# Patient Record
Sex: Male | Born: 1985
Health system: Southern US, Community
[De-identification: ages and names within clinical notes are randomized; demographics above are authoritative.]

## PROBLEM LIST (undated history)

## (undated) DIAGNOSIS — I8390 Asymptomatic varicose veins of unspecified lower extremity: Secondary | ICD-10-CM

## (undated) DIAGNOSIS — M4310 Spondylolisthesis, site unspecified: Secondary | ICD-10-CM

## (undated) DIAGNOSIS — Z9889 Other specified postprocedural states: Secondary | ICD-10-CM

## (undated) DIAGNOSIS — Q909 Down syndrome, unspecified: Secondary | ICD-10-CM

## (undated) DIAGNOSIS — E039 Hypothyroidism, unspecified: Secondary | ICD-10-CM

## (undated) HISTORY — PX: ABLATION SAPHENOUS VEIN W/ RFA: SUR11

## (undated) HISTORY — DX: Hypothyroidism, unspecified: E03.9

## (undated) HISTORY — DX: Down syndrome, unspecified: Q90.9

## (undated) HISTORY — PX: WISDOM TOOTH EXTRACTION: SHX21

## (undated) HISTORY — PX: SPINE SURGERY: SHX786

## (undated) HISTORY — DX: Asymptomatic varicose veins of unspecified lower extremity: I83.90

## (undated) HISTORY — DX: Other specified postprocedural states: Z98.890

## (undated) HISTORY — DX: Spondylolisthesis, site unspecified: M43.10

---
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
## 2007-01-14 ENCOUNTER — Ambulatory Visit: Payer: Self-pay | Admitting: "Endocrinology

## 2007-07-22 ENCOUNTER — Ambulatory Visit: Payer: Self-pay | Admitting: "Endocrinology

## 2008-05-18 ENCOUNTER — Ambulatory Visit: Payer: Self-pay | Admitting: "Endocrinology

## 2008-08-23 ENCOUNTER — Encounter: Payer: Self-pay | Admitting: Internal Medicine

## 2009-04-25 ENCOUNTER — Encounter: Payer: Self-pay | Admitting: Internal Medicine

## 2009-04-25 ENCOUNTER — Ambulatory Visit: Payer: Self-pay | Admitting: Vascular Surgery

## 2009-06-16 ENCOUNTER — Ambulatory Visit: Payer: Self-pay | Admitting: Internal Medicine

## 2009-06-16 DIAGNOSIS — L851 Acquired keratosis [keratoderma] palmaris et plantaris: Secondary | ICD-10-CM | POA: Insufficient documentation

## 2009-06-16 DIAGNOSIS — E039 Hypothyroidism, unspecified: Secondary | ICD-10-CM

## 2009-06-16 DIAGNOSIS — I8390 Asymptomatic varicose veins of unspecified lower extremity: Secondary | ICD-10-CM | POA: Insufficient documentation

## 2009-07-25 ENCOUNTER — Ambulatory Visit: Payer: Self-pay | Admitting: Vascular Surgery

## 2009-08-21 ENCOUNTER — Ambulatory Visit: Payer: Self-pay | Admitting: Vascular Surgery

## 2009-08-28 ENCOUNTER — Ambulatory Visit: Payer: Self-pay | Admitting: Vascular Surgery

## 2010-03-02 ENCOUNTER — Ambulatory Visit: Payer: Self-pay | Admitting: Internal Medicine

## 2010-03-12 ENCOUNTER — Encounter: Payer: Self-pay | Admitting: *Deleted

## 2010-03-12 LAB — CONVERTED CEMR LAB
Basophils Relative: 1.5 % (ref 0.0–3.0)
Calcium: 9.4 mg/dL (ref 8.4–10.5)
Cholesterol: 218 mg/dL — ABNORMAL HIGH (ref 0–200)
Direct LDL: 163.9 mg/dL
Eosinophils Absolute: 0 10*3/uL (ref 0.0–0.7)
Glucose, Bld: 87 mg/dL (ref 70–99)
HCT: 44.3 % (ref 39.0–52.0)
Hemoglobin: 15.2 g/dL (ref 13.0–17.0)
Lymphs Abs: 1.7 10*3/uL (ref 0.7–4.0)
MCHC: 34.3 g/dL (ref 30.0–36.0)
MCV: 102.1 fL — ABNORMAL HIGH (ref 78.0–100.0)
Neutro Abs: 2.1 10*3/uL (ref 1.4–7.7)
Neutrophils Relative %: 48.1 % (ref 43.0–77.0)
Platelets: 214 10*3/uL (ref 150.0–400.0)
RBC: 4.34 M/uL (ref 4.22–5.81)
RDW: 14.1 % (ref 11.5–14.6)
Triglycerides: 185 mg/dL — ABNORMAL HIGH (ref 0.0–149.0)

## 2010-03-19 ENCOUNTER — Telehealth: Payer: Self-pay | Admitting: Adult Health

## 2010-05-15 NOTE — Assessment & Plan Note (Signed)
Summary: to be est.ok per [email or web address]   Vital Signs:  Patient profile:   25 year old male Height:      64.25 inches Weight:      152 pounds BMI:     25.98 Pulse rate:   72 / minute BP sitting:   120 / 80  (right arm) Cuff size:   regular  Vitals Entered By: Romualdo Bolk, CMA (AAMA) (June 16, 2009 12:59 PM) CC: New Pt to get establish   History of Present Illness: Roy Carr comesin today for   with mom for  first time visit, Mom brings in medical records to review. He has Downs syndrome and has been cared for by peds Dr Roy Carr and endo Dr Roy Carr for hypothyroidism dx in 2006   that has been stable last check In Jan 2011. He also  has a problem with  symptomatic varicose vein on the left leg  and plan to have procedure  in June .  Other issues  DERM:    dandruff SD  controlled with nizoral shampoo and Lachydrin    OTC two times a day    for bumpy skin.    He has no CV Pulm  concerns or dx. Ortho:  had surgery for spondyloethesis grade II per Dr Roy Carr in 2006 and done well since then. Neg c spine xray for instability  . has  past part in special olympics.  He is UTD on immunizations . GI no concerns  "eats everythin"   neg screen for celiac in past. DEV: speech and language delay. volunteers and work programs at [employer]  and [employer]  Preventive Care Screening  Last Tetanus Booster:    Date:  03/04/2007    Results:  Tdap    Preventive Screening-Counseling & Management  Alcohol-Tobacco     Alcohol drinks/day: 0     Smoking Status: never     Passive Smoke Exposure: no  Caffeine-Diet-Exercise     Caffeine use/day: 0     Does Patient Exercise: yes  Hep-HIV-STD-Contraception     Dental Visit-last 6 months yes     Sun Exposure-Excessive: no  Safety-Violence-Falls     Seat Belt Use: yes     Firearms in the Home: no firearms in the home     Smoke Detectors: yes  Current Medications (verified): 1)  Synthroid 25 Mcg Tabs (Levothyroxine Sodium) .Marland Kitchen.. 1 and 1/2 By Mouth  Once Daily 2)  Ketoconazole 2 % Sham (Ketoconazole) .... Apply To Scalp As Needed  Allergies (verified): No Known Drug Allergies  Past History:  Past Medical History: 38 weeks 6 6 PT labor at 24 weeks  Trisomy 21 Neg CV eval. no cervical instability by xray. Spondlyloesthesis  Grade II  corrected  hypothyroid  2006  Varicose veins  Past Surgical History: Back Surgery 2006 Roy Carr  Wisdom teeth.    Past History:  Care Management: Vascular Surgery: Roy Carr dentist. Roy Carr     Prev PCP Roy Carr  Family History: Father: Healthy  had melanoma Mother: Healthy Siblings: 2 Sister- Roy Carr- Healthy, Roy Carr- Healthy  Social History: Legal Guardian: Roy Carr Sleep   doing well.   HH of 3  2 dogs  no ETS . swimming  and   drawing and singing .  2 hours HT  and  3  [employer]  to do [employer] job.  Finished High school programSmoking Status:  never Passive Smoke Exposure:  no Caffeine use/day:  0 Does Patient Exercise:  yes Seat Belt Use:  yes Dental Care w/in 6 mos.:  yes Sun Exposure-Excessive:  no  Review of Systems  The patient denies anorexia, fever, weight loss, weight gain, vision loss, decreased hearing, hoarseness, chest pain, syncope, dyspnea on exertion, prolonged cough, hemoptysis, melena, hematochezia, severe indigestion/heartburn, hematuria, incontinence, transient blindness, difficulty walking, abnormal bleeding, enlarged lymph nodes, and angioedema.    Physical Exam  General:  alert, well-nourished, and well-hydrated.  pleasanit   cooperative nteractive downs appearing in nad  Head:  atraumatic.  flat occiput Eyes:  vision grossly intact.   Ears:  R ear normal and L ear normal.   Nose:  no external erythema.   Mouth:  pharynx pink and moist.  teeth some mallocclusion adequate repair Neck:  supple and no masses.   Lungs:  Normal respiratory effort, chest expands symmetrically. Lungs are clear to auscultation, no crackles or  wheezes. Heart:  Normal rate and regular rhythm. S1 and S2 normal without gallop, murmur, click, rub or other extra sounds.no lifts.   Abdomen:  Bowel sounds positive,abdomen soft and   noted. Msk:  no joint swelling, no joint warmth, and no redness over joints.   Pulses:  nl cap refill  Extremities:  left LE  large protuberant VV noulcerations or redness  trc edema Neurologic:  alert & oriented X3 and gait normal.  grossly non focal  speech  delayed and some disarticulation Skin:  dry skin no ecchymoses, no petechiae, and no purpura.   Cervical Nodes:  No lymphadenopathy noted Psych:  good eye contact.     Impression & Recommendations:  Problem # 1:  DOWN SYNDROME (ICD-758.0) no CV  complications   agree with yearly or q o year CBC monitoring     .   get lipid studies fbs  with next blood tests.   Problem # 2:  HYPOTHYROIDISM (ICD-244.9) last tsh nl  this year   .Ok to follow with PCP per Dr Roy Carr as his levels are stable.  His updated medication list for this problem includes:    Synthroid 25 Mcg Tabs (Levothyroxine sodium) .Marland Kitchen... 1 and 1/2 by mouth once daily  Problem # 3:  VARICOSE VEINS, LOWER EXTREMITIES (ICD-454.9)  Problem # 4:  XERODERMA (ICD-701.1)  Complete Medication List: 1)  Synthroid 25 Mcg Tabs (Levothyroxine sodium) .Marland Kitchen.. 1 and 1/2 by mouth once daily 2)  Ketoconazole 2 % Sham (Ketoconazole) .... Apply to scalp as needed  Patient Instructions: 1)  TSH  Free T4  LIPID    and CBCdiff and platelets , BMP    Late Fall    2)  (Dx  Downs ,    and hypothyroid    V70.  ) 3)  if ok then wellness check   next   [facility] records reviewed  to be scanned and returned to mom.

## 2010-05-15 NOTE — Letter (Signed)
Summary: Records from [facility] 2951 - 2010  Records from [facility] 8841 - 2010   Imported By: Maryln Gottron 06/21/2009 14:59:37  _____________________________________________________________________  External Attachment:    Type:   Image     Comment:   External Document

## 2010-05-15 NOTE — Letter (Signed)
Summary: Records brought in by Mother 1989 - 2011  Records brought in by Mother April 20, 1987 - 2011   Imported By: Maryln Gottron 06/21/2009 14:51:57  _____________________________________________________________________  External Attachment:    Type:   Image     Comment:   External Document

## 2010-05-15 NOTE — Letter (Signed)
Summary: Generic Letter  [facility]  [address] [place], [place] [address]   Phone: [phone]  Fax: [phone]    03/12/2010  Ariston Oquin [address] [place], [place]  [address]  Dear Mr. GUESS,  (1) TSH (TSH)   FastTSH                   1.91 uIU/mL                 0.35-5.50  Tests: (2) T4, Free (FT4R)   Free T4                   0.88 ng/dL                  0.60-1.60  Tests: (3) Lipid Panel (LIPID)   Cholesterol          [H]  218 mg/dL                   5-784     ATP III Classification            Desirable:  < 200 mg/dL                    Borderline High:  200 - 239 mg/dL               High:  > = 240 mg/dL   Triglycerides        [H]  185.0 mg/dL                 6.9-629.5     Normal:  <150 mg/dL     Borderline High:  284 - 199 mg/dL   HDL                  [L]  13.24 mg/dL                 >40.10   VLDL Cholesterol          37.0 mg/dL                  2.7-25.3  CHO/HDL Ratio:  CHD Risk                             7                    Men          Women     1/2 Average Risk     3.4          3.3     Average Risk          5.0          4.4     2X Average Risk          9.6          7.1     3X Average Risk          15.0          11.0                           Tests: (4) CBC Platelet w/Diff (CBCD)   White Cell Count     [L]  4.3 K/uL  4.5-10.5   Red Cell Count            4.34 Mil/uL                 4.22-5.81   Hemoglobin                15.2 g/dL                   09.8-11.9   Hematocrit                44.3 %                      39.0-52.0   MCV                  [H]  102.1 fl                    78.0-100.0   MCHC                      34.3 g/dL                   14.7-82.9   RDW                       14.1 %                      11.5-14.6   Platelet Count            214.0 K/uL                  150.0-400.0   Neutrophil %              48.1 %                      43.0-77.0   Lymphocyte %              39.5 %                       12.0-46.0   Monocyte %                9.7 %                       3.0-12.0   Eosinophils%              1.2 %                       0.0-5.0   Basophils %               1.5 %                       0.0-3.0   Neutrophill Absolute      2.1 K/uL                    1.4-7.7   Lymphocyte Absolute       1.7 K/uL                    0.7-4.0   Monocyte Absolute         0.4 K/uL                    0.1-1.0  Eosinophils,  Absolute                             0.0 K/uL                    0.0-0.7   Basophils Absolute        0.1 K/uL                    0.0-0.1  Tests: (5) BMP (METABOL)   Sodium                    139 mEq/L                   135-145   Potassium                 4.6 mEq/L                   3.5-5.1   Chloride                  101 mEq/L                   96-112   Carbon Dioxide            30 mEq/L                    19-32   Glucose                   87 mg/dL                    78-29   BUN                       11 mg/dL                    5-62   Creatinine                0.9 mg/dL                   1.3-0.8   Calcium                   9.4 mg/dL                   6.5-78.4   GFR                       117.62 mL/min               >60  Tests: (6) Cholesterol LDL - Direct (DIRLDL)  Cholesterol LDL - Direct                             163.9 mg/dL     Optimal:  <696 mg/dL     Near or Above Optimal:  100-129 mg/dL     Borderline High:  295-284 mg/dL     High:  132-440 mg/dL     Very High:  >102 mg/dL  We tried to call you about Tao's lab results but the number we have is a wrong number. Dr. Fabian Sharp wanted to let you know that his thyroid test is normal. Lipids are somewhat elevated. Recommend dietary intervention. Avoid/ limit sweets and animal fats. Also, his blood count  showed no anemia or abnormal white cells. Please call us at [phone] if you have any questions.         Sincerely,   Tor Netters, CMA (AAMA)

## 2010-05-15 NOTE — Progress Notes (Signed)
  Phone Note Other Incoming Call back at father.    Summary of Call: 5 days of cough, congestion, thick green mucus. Brother with similar symptoms. cough is no better.  requested zpack .    Follow-up for Phone Call        URI symptoms x 5 days no better  no dyspnea, wheezing or fever.  advised on mucinex dm two times a day  Please contact office for sooner follow up if symptoms do not improve or worsen  may have zpack w/ no refills  rx sent.  OV in not improving.  Follow-up by: Tammy Parrett NP,  March 19, 2010 2:20 PM    New/Updated Medications: ZITHROMAX Z-PAK 250 MG TABS (AZITHROMYCIN) take as directed. Prescriptions: ZITHROMAX Z-PAK 250 MG TABS (AZITHROMYCIN) take as directed.  #1 x 0   Entered and Authorized by:   Rubye Oaks NP   Signed by:   Rubye Oaks NP on 03/19/2010   Method used:   Electronically to        [facility]* (retail)       [address]       [address] [place] [employer]       [place], [place]  [address]       Ph: [phone]       Fax: [phone]   RxID:   [id number]

## 2010-05-25 ENCOUNTER — Other Ambulatory Visit: Payer: Self-pay | Admitting: *Deleted

## 2010-05-25 MED ORDER — LEVOTHYROXINE SODIUM 25 MCG PO TABS: ORAL_TABLET | ORAL | Status: DC

## 2010-06-16 ENCOUNTER — Encounter: Payer: Self-pay | Admitting: Internal Medicine

## 2010-06-22 ENCOUNTER — Ambulatory Visit (INDEPENDENT_AMBULATORY_CARE_PROVIDER_SITE_OTHER): Payer: Commercial Managed Care - PPO | Admitting: Internal Medicine

## 2010-06-22 ENCOUNTER — Encounter: Payer: Self-pay | Admitting: Internal Medicine

## 2010-06-22 DIAGNOSIS — E039 Hypothyroidism, unspecified: Secondary | ICD-10-CM

## 2010-06-22 DIAGNOSIS — E785 Hyperlipidemia, unspecified: Secondary | ICD-10-CM

## 2010-06-22 DIAGNOSIS — I8390 Asymptomatic varicose veins of unspecified lower extremity: Secondary | ICD-10-CM

## 2010-06-22 DIAGNOSIS — Q909 Down syndrome, unspecified: Secondary | ICD-10-CM

## 2010-06-22 DIAGNOSIS — Z0000 Encounter for general adult medical examination without abnormal findings: Secondary | ICD-10-CM

## 2010-06-22 DIAGNOSIS — M4310 Spondylolisthesis, site unspecified: Secondary | ICD-10-CM | POA: Insufficient documentation

## 2010-06-22 DIAGNOSIS — L210 Seborrhea capitis: Secondary | ICD-10-CM

## 2010-06-22 DIAGNOSIS — L219 Seborrheic dermatitis, unspecified: Secondary | ICD-10-CM

## 2010-06-22 DIAGNOSIS — L218 Other seborrheic dermatitis: Secondary | ICD-10-CM

## 2010-06-22 LAB — CBC WITH DIFFERENTIAL/PLATELET
Basophils Absolute: 0.1 10*3/uL (ref 0.0–0.1)
Lymphocytes Relative: 16.8 % (ref 12.0–46.0)
Lymphs Abs: 1.2 10*3/uL (ref 0.7–4.0)
MCHC: 34.9 g/dL (ref 30.0–36.0)
Neutrophils Relative %: 74.9 % (ref 43.0–77.0)
Platelets: 210 10*3/uL (ref 150.0–400.0)
WBC: 7.1 10*3/uL (ref 4.5–10.5)

## 2010-06-22 LAB — HEPATIC FUNCTION PANEL
ALT: 28 U/L (ref 0–53)
AST: 21 U/L (ref 0–37)
Albumin: 4.2 g/dL (ref 3.5–5.2)
Alkaline Phosphatase: 74 U/L (ref 39–117)
Bilirubin, Direct: 0.2 mg/dL (ref 0.0–0.3)

## 2010-06-22 LAB — LDL CHOLESTEROL, DIRECT: Direct LDL: 159.7 mg/dL

## 2010-06-22 LAB — BASIC METABOLIC PANEL
BUN: 17 mg/dL (ref 6–23)
Calcium: 9.5 mg/dL (ref 8.4–10.5)
GFR: 123.92 mL/min (ref >60.00)
Glucose, Bld: 86 mg/dL (ref 70–99)

## 2010-06-22 LAB — LIPID PANEL
Cholesterol: 212 mg/dL — ABNORMAL HIGH (ref 0–200)
Triglycerides: 117 mg/dL (ref 0.0–149.0)
VLDL: 23.4 mg/dL (ref 0.0–40.0)

## 2010-06-22 LAB — TSH: TSH: 1.47 u[IU]/mL (ref 0.35–5.50)

## 2010-06-22 NOTE — Patient Instructions (Signed)
Wt Readings from Last 3 Encounters:  06/22/10 140 lb (63.504 kg)  06/16/09 152 lb (68.947 kg)    Will notify you  of labs when available. Check yearly or as needed.

## 2010-06-23 ENCOUNTER — Encounter: Payer: Self-pay | Admitting: Internal Medicine

## 2010-06-23 DIAGNOSIS — L219 Seborrheic dermatitis, unspecified: Secondary | ICD-10-CM | POA: Insufficient documentation

## 2010-06-23 NOTE — Progress Notes (Signed)
Subjective:    Patient ID: Roy Carr, male    DOB: 1985/05/25, 25 y.o.   MRN: [id number]  HPI Comes in today for yearly preventive check up and monitoring  With his mom. No major change in health status since last visit .   He underwent rx for his VV and has done very well .Decrease unhealthy snacks and has lost weight.   Now has braces  Per Dr Clovis Riley .  Eye checks stable.   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  Exercising walks with mom and father . 2 HH mostly at moms  Father comes and visits.  Past Medical History  Diagnosis Date  . Trisomy 21   . Spondylisthesis     grade 2 corrected  . Hypothyroid     prev per Dr Fransico Michael   . Varicose veins     intervention 5 2011   Past Surgical History  Procedure Date  . Spine surgery   . Wisdom tooth extraction   . Ablation saphenous vein w/ rfa     reports that he has never smoked. He does not have any smokeless tobacco history on file. He reports that he does not drink alcohol. His drug history not on file. family history includes Melanoma in his father. No Known Allergies  Review of Systems No chest pain sob edema skin changes although dandruff is problematic as increasing again even with the ketoconazole and increase use. No skin changes otherwise .  No nvd, change in bowel habits, injury.  Rest of ros negative .     Objective:   Physical Exam .   Physical Exam: Vital signs reviewed WUJ:[id number] appearing wd in nad cooperative  Downs facies in nad . HEENT: microcephalic atraumatic , Eyes: PERRL EOM's full, conjunctiva clear, Nares: patent no deformity discharge or tenderness., Ears: EAC's clear TMs with normal landmarks. Mouth: clear OP, no lesions, edema.  Moist mucous membranes. Dentition in adequate repair.  Has on braces   Airway good NECK: supple without masses, thyromegaly or bruits. CHEST/PULM:  Clear to auscultation and percussion breath  sounds equal no wheeze , rales or rhonchi. No chest wall deformities or tenderness. CV: PMI is nondisplaced, S1 S2 no gallops, murmurs, rubs. Peripheral pulses are full without delay.No JVD .  ABDOMEN: Bowel sounds normal nontender  No guard or rebound, no hepato splenomegal no CVA tenderness.  No hernia. Extremtities:  No clubbing cyanosis or edema, no acute joint swelling or redness no focal atrophy   No vv seen today  Incurved 5th digits grip nl.  NEURO:  , cranial nerves 3-12 appear to be intact, no obvious focal weakness,gait within normal limits no asymmetrical reflexes  Back without scoliosis  Speech non sensical but responsive to direction.  SKIN: No acute rashes normal turgor, color, no bruising or petechiae. dryness on back and sun changes  Scalp with SD mild mod PSYCH: Oriented, good eye contact, no obvious depression anxiety, cognition and judgment appear normal. LN:  No cervical axillary or inguinal adenopathy Ext GU   : no hernia or test mass.      Assessment & Plan:  Preventive care : Weight much better   Continue lifestyle intervention healthy eating and exercise .  Trisomy 21 :   No complications except thyroid associated at this time. Dandruff:   No other sd seen   Increase time to scalp.  Monitoring for high risk situation   Labs today  To include cbc diff .  Thyroid:  rescheck tsh continue meds as appropriate.  If doing well check yearly.

## 2010-06-23 NOTE — Assessment & Plan Note (Signed)
Follow much improved  After intervention

## 2010-06-23 NOTE — Assessment & Plan Note (Signed)
ketoconcazole had been working but not as well now   Other dandruff shampoos and  Tar shampoos not as helpful rec  Increase time exposure to scalp for now . Consider derm  Input if needed sig sd rash otherwise

## 2010-07-02 ENCOUNTER — Encounter: Payer: Self-pay | Admitting: *Deleted

## 2010-07-04 ENCOUNTER — Telehealth: Payer: Self-pay | Admitting: *Deleted

## 2010-07-04 NOTE — Telephone Encounter (Signed)
Mom is calling for lab results, please.

## 2010-07-05 NOTE — Telephone Encounter (Signed)
Spoke to mom and she did get our letter on 3/21 and she is aware of the results. She is going to call back next year to schedule his cpx.

## 2010-07-24 ENCOUNTER — Telehealth: Payer: Self-pay | Admitting: Internal Medicine

## 2010-07-24 MED ORDER — KETOCONAZOLE 2 % EX SHAM: MEDICATED_SHAMPOO | CUTANEOUS | Status: DC

## 2010-07-24 NOTE — Telephone Encounter (Signed)
Dan's mother Requesting refill on shampoo

## 2010-08-22 ENCOUNTER — Other Ambulatory Visit: Payer: Self-pay | Admitting: Internal Medicine

## 2010-08-28 NOTE — Consult Note (Signed)
NEW PATIENT CONSULTATION   Roy Carr, Roy Carr  DOB:  01/15/1986                                       04/25/2009  GNFAO#:[id number]   The patient is a 25 year old male patient with Down's syndrome who was  referred for progressive of venous insufficiency of the left leg with  bulging varicosities.  This patient was noted by his mother to have  bulging varicosities in the medial calf over the great saphenous system  about 2-3 years ago, and these have increased in size and he has  developed noticeable discrepancy in calf circumference as well.  He has  no history of venous stasis ulcers, hyperpigmentation ulceration or  bleeding varicosities.  Has not worn elastic compression stockings, nor  does he elevate the legs on a regular basis.  His mother notes that  these have clearly increased in size and the edema in the left leg has  become more apparent.   CHRONIC MEDICAL PROBLEMS:  1. Down syndrome.  2. Hypothyroidism.  3. Negative for coronary artery disease, diabetes, COPD, hypertension      or stroke.   FAMILY HISTORY:  Negative for coronary artery disease, diabetes and  stroke.   SOCIAL HISTORY:  He is single.  He is a Consulting civil engineer.  Does not use tobacco  or alcohol.   REVIEW OF SYSTEMS:  Otherwise unremarkable, provided by his mother.   PHYSICAL EXAMINATION:  Blood pressure 126/91, heart rate 72,  respirations 20.  Generally, he is a well-developed, well-nourished male  who is in no apparent distress.  He is alert and oriented x3.  HEENT:  His pupils are equal and reactive.  EOMs intact.  Neck is supple, 3+  carotid pulses.  No bruits are audible.  Chest:  Clear to auscultation.  Cardiovascular exam is a regular rhythm.  No murmurs.  Abdomen is soft,  nontender, with no palpable masses.  Musculoskeletal exam reveals no  major deformities.  Skin reveals no rashes.  He has 3+ femoral,  popliteal and dorsalis pedis pulses bilaterally.  Left leg has bulging  varicosities over the left great saphenous system beginning at the knee  level and extending down into the medial calf.  There is a 1.5 to 2-cm  discrepancy in the circumference of the left calf larger than the right,  but there is no hyperpigmentation or ulceration noted.   I ordered a venous duplex exam today and reviewed this.  His deep venous  system is normal and he has gross reflux in the great saphenous vein  from the knee to the saphenofemoral junction with a large vein.   This patient clearly has bulging varicosities secondary to left great  saphenous reflux and I think with time will continue to develop  worsening of these varicosities and secondary problems.  We will treat  him with long-leg elastic compression stockings (20 mm-30 mm gradient)  as well as elevation of the legs and ibuprofen.  He will return in 3  months for continued followup.  His best treatment  would be laser  ablation of his left great saphenous system with multiple stab  phlebectomies.     Quita Skye Hart Rochester, M.D.  Electronically Signed   JDL/MEDQ  D:  04/25/2009  T:  04/26/2009  Job:  [id number]

## 2010-08-28 NOTE — Procedures (Signed)
DUPLEX DEEP VENOUS EXAM - LOWER EXTREMITY   INDICATION:  Follow up left greater saphenous vein ablation.   HISTORY:  Edema:  No.  Trauma/Surgery:  Left greater saphenous vein ablation, 08/21/2009 by Dr.  Hart Rochester.  Pain:  No.  PE:  No.  Previous DVT:  No.  Anticoagulants:  No.  Other:   DUPLEX EXAM:                CFV   SFV   PopV  PTV    GSV                R  L  R  L  R  L  R   L  R  L  Thrombosis    o  o     o     o      o     +  Spontaneous   +  +     +     +      +     o  Phasic        +  +     +     +      +     o  Augmentation  +  +     +     +      +     o  Compressible  +  +     +     +      +     o  Competent     +  d     d     +      +     o   Legend:  + - yes  o - no  p - partial  D - decreased   IMPRESSION:  1. No evidence of deep venous thrombosis in the left lower extremity      or right common femoral vein.  2. Evidence of ablated left greater saphenous vein with no flow from      proximal calf to near saphenofemoral junction.  3. Evidence of thrombosed varicosities noted.  4. Evidence of mild left deep venous reflux.    _____________________________  Quita Skye. Hart Rochester, M.D.   AS/MEDQ  D:  08/28/2009  T:  08/28/2009  Job:  [id number]

## 2010-08-28 NOTE — Assessment & Plan Note (Signed)
OFFICE VISIT   AZAAN, LEASK  DOB:  25-Jun-1985                                       08/21/2009  ZOXWR#:[id number]   The patient today had laser ablation of his left great saphenous vein  with 8 stab phlebectomies in the calf and ankle area for painful  varicosities and edema of the left leg and gross reflux in the left  great saphenous vein.  He tolerated the procedure well.  He will return  in 1 week for venous duplex exam for follow-up.     Quita Skye Hart Rochester, M.D.  Electronically Signed   JDL/MEDQ  D:  08/21/2009  T:  08/22/2009  Job:  [id number]

## 2010-08-28 NOTE — Assessment & Plan Note (Signed)
OFFICE VISIT   DRESDEN, LOZITO  DOB:  [birth year]                                       07/25/2009  ZOXWR#:[id number]   The patient returns today for further evaluation of his severe bulging  varicosities in the left leg secondary to saphenous reflux in the left  great saphenous vein.  According to the patient's mother the  varicosities have become more prominent, has had some swelling which  worsens as the day progresses in the mid calf and ankle area.  The  varicosities are painful when manipulated.  He has no history of stasis  ulcers or bleeding.  He has been wearing long-leg elastic compression  stockings as well as taking ibuprofen and elevating the legs on a  regular basis with no improvement.  It is affecting his daily living.   Venous duplex performed last week revealed a very large left great  saphenous vein with gross reflux from the knee to the saphenofemoral  junction with a normal deep venous system.  He has bulging varicosities  on physical exam in the distal thigh and medial calf area of the left  leg with a 1.5 to 2 cm increase in circumference at the left calf and  ankle compared to the right.  He has 3+ dorsalis pedis pulse palpable.   I think this patient needs laser ablation of his left great saphenous  vein with multiple stab phlebectomy to be performed as a single  procedure.  We will proceed with precertification to perform this in the  near future to relieve his symptoms of venous hypertension.     Quita Skye Hart Rochester, M.D.  Electronically Signed   JDL/MEDQ  D:  07/25/2009  T:  07/26/2009  Job:  [id number]

## 2010-08-28 NOTE — Procedures (Signed)
LOWER EXTREMITY VENOUS REFLUX EXAM   INDICATION:  Left lower extremity varicose veins with pain and swelling.   EXAM:  Using color-flow imaging and pulse Doppler spectral analysis, the  left common femoral, superficial femoral, popliteal, posterior tibial,  greater and lesser saphenous veins are evaluated.  There is no evidence  suggesting deep venous insufficiency in the left lower extremity.   The left saphenofemoral junction is not competent with Reflux of  >550milliseconds. The left GSV is not competent with Reflux of  >569milliseconds with the caliber as described below.   The left proximal short saphenous vein demonstrates competency.   GSV Diameter (used if found to be incompetent only)                                            Right    Left  Proximal Greater Saphenous Vein           cm       0.95 cm  Proximal-to-mid-thigh                     cm       0.95 cm  Mid thigh                                 cm       0.60 cm  Mid-distal thigh                          cm       0.60 cm  Distal thigh                              cm       0.58 cm  Knee                                      cm       0.97 cm   IMPRESSION:  1. Left greater saphenous vein Reflux with >572milliseconds is      identified with the caliber ranging from 0.97 cm to 1.12 cm knee to      groin.  2. The left greater saphenous vein is not aneurysmal.  3. The left greater saphenous vein is not tortuous.  4. The deep venous system is competent.  5. The left lesser saphenous vein is competent.  6. No evidence of deep venous thrombosis noted in the left leg.        ___________________________________________  Quita Skye. Hart Rochester, M.D.   MG/MEDQ  D:  04/25/2009  T:  04/25/2009  Job:  [id number]

## 2010-08-28 NOTE — Assessment & Plan Note (Signed)
OFFICE VISIT   Roy Carr, Roy Carr  DOB:  1986/03/05                                       08/28/2009  ZOXWR#:[id number]   Patient returns today 1 week post laser ablation of his left great  saphenous vein with multiple stab phlebectomies for painful varicosities  and chronic edema in the left leg.  He does not complain of any  discomfort associated with either the stab phlebectomy sites or the  laser ablation.  He has no distal edema.   On physical exam, he has 2+ to 3+ dorsalis pedis pulse palpable in the  left foot.  He has been wearing his elastic compression stockings and  taking ibuprofen as instructed.   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  He was  reassured regarding these findings and will return to see [place] on a p.r.n.  basis.     Quita Skye Hart [place], M.D.  Electronically Signed   JDL/MEDQ  D:  08/28/2009  T:  08/29/2009  Job:  [id number]

## 2010-08-31 NOTE — Discharge Summary (Signed)
NAME:  Roy, Carr NO.:  [id number]   MEDICAL RECORD NO.:  [id number]          PATIENT TYPE:  INP   LOCATION:  6152                         FACILITY:  [facility]   PHYSICIAN:  Stefani Dama, M.D.  DATE OF BIRTH:  08-28-1985   DATE OF ADMISSION:  12/20/2004  DATE OF DISCHARGE:  12/23/2004                                 DISCHARGE SUMMARY   ADMITTING DIAGNOSES:  1.  Spondylolisthesis with lumbar stenosis, lumbar radiculopathy, cauda      equinus syndrome.  2.  Down syndrome.   DISCHARGE DIAGNOSES:  1.  Spondylolisthesis with lumbar stenosis, lumbar radiculopathy, cauda      equinus syndrome.  2.  Down syndrome.   CONDITION ON DISCHARGE:  Improving.   HOSPITAL COURSE:  Mr. [name] is a 25 year old individual who has had  progressive difficulty with gait and complaints of back and leg pain.  He  was found to have a severe spondylolisthesis at grade 3 with a tight lumbar  stenosis.  He was beginning to develop symptoms and signs of a cauda equina  syndrome with progressive weakness and ataxia and apparent one episode of  bowel incontinence.  He was advised regarding surgical decompression and  this was performed on December 20, 2004.  Postoperatively, the patient was  maintained in the pediatric intensive care unit.  He was maintained on a PCA  pump with morphine and his pain control was quite good.  On the second  postoperative day, the patient did have some desaturation and he was placed  on 2 liters of oxygen which improved his oxygen saturation to the low 90s.  Chest x-ray seemed to indicate a hypo aeration.  The PCA pump was stopped.  The patient was started on oral codeine elixir and he seemed to tolerate  this well.  He has become ambulatory with assistance and his incision is  clean and dry.  He has been afebrile save for one episode during the time  that he was experiencing some respiratory deterioration where his  temperature went up to 100.6.  At  the current time, he is being managed by  his mom who feels that she can handle his care at home.  He was given a  prescription for Tylenol with codeine elixir to be used for pain control.  He has also been given a dose of MiraLax as he has not had any movement of  his bowels since the time of surgery.  The patient has had a Foley catheter  that was removed 24 hours prior to discharge and he is voiding spontaneously  and appears to have good control of his bladder.  At the current time, the  patient will be followed up in about 3 weeks' time in the office.  Mom has  also been given a prescription for Skelaxin 400 milligrams to use for muscle  spasms should this become an issue.  It has been noted that the patient's  gait already is improving with him standing in a much more was upright and  erect posture than he had been using prior  to surgical intervention.   CONDITION ON DISCHARGE:  Improved.      Stefani Dama, M.D.  Electronically Signed    HJE/MEDQ  D:  12/23/2004  T:  12/23/2004  Job:  [id number]

## 2010-08-31 NOTE — Op Note (Signed)
NAME:  Roy Carr, Roy Carr NO.:  [id number]   MEDICAL RECORD NO.:  [id number]          PATIENT TYPE:  INP   LOCATION:  6152                         FACILITY:  [facility]   PHYSICIAN:  Stefani Dama, M.D.  DATE OF BIRTH:  07/21/1985   DATE OF PROCEDURE:  12/20/2004  DATE OF DISCHARGE:                                 OPERATIVE REPORT   PREOPERATIVE DIAGNOSIS:  Spondylolisthesis grade 3 with lumbar radiculopathy  and spinal stenosis.   POSTOPERATIVE DIAGNOSIS:  Spondylolisthesis grade 3 with lumbar  radiculopathy and spinal stenosis.   PROCEDURE:  L5 Gill procedure, posterior interbody arthrodesis with peak  bone spacer L5-S1, segmental fixation L4 to sacrum with posterolateral  arthrodesis using iliac crest bone graft from right posterior superior iliac  crest.   SURGEON:  Barnett Abu, M.D.   FIRST ASSISTANT:  Hilda Lias, M.D.   ANESTHESIA:  General endotracheal.   INDICATIONS:  Roy Carr is a 25 year old individual who has Down syndrome. He  has dysplasia of the lumbosacral junction and has a grade 3  spondylolisthesis with tight stenosis at L5-S1 and he was evaluated as an  outpatient and was advised regarding surgical decompression arthrodesis. He  is taken to the operating room at the current time to undergo this  procedure.   PROCEDURE IN DETAIL:  The patient was brought to the operating room on the  stretcher. After the smooth induction of general endotracheal anesthesia and  placement of an IV catheter and a Foley, he was turned prone on the  operating table. The bony prominences were appropriately padded and  protected. The back was shaved with clippers, prepped with DuraPrep, and  draped in a sterile fashion. Midline incision was created over the  lumbosacral junction. This was carried down to the lumbodorsal fascia. The  fascia was opened on either side of the midline to expose the spinous  processes of L4, L5, and the sacrum. Positive localization  was obtained with  x-ray verification. Then, doing a subperiosteal dissection, the soft tissues  were dissected aside to expose the entirety of the laminar arch of L5. It  was noted to be moderately loose and it did appear to have a dysplastic  shape with wide high points on the edges of the lateral borders of the  facets. These were then taken down in a piecemeal fashion and the spinous  processes and laminar arches were harvested and saved for bone graft. The  laminar arch was opened to allow decompression of the posterior aspect of  the spinal canal. This was done very meticulously so as to preserve the  integrity of the dura. The facette joints at L5-S1 were completely taken  down. At L4-L5, the facette joints were slightly undercut so as to allow  identification of the L5 pedicle and also allow placement of a pedicle screw  ultimately at L5. Dissection was then carried out to L4 in the transverse  process region. The pedicle entry site could be chosen on the lateral aspect  of the facet joint at L4. These areas were then packed away off to the  sides. The interspace at L5-S1 was then inspected carefully. The disk was  noted to be significantly degenerated and had a large pseudobulge  posteriorly. The disk space, itself, was then incised and a moderate degree  of disk degeneration was encountered in the disk at L5-S1. Some rigidity of  the disk remained. However, carefully, it was scraped away from the  cartilaginous end plates and the cartilaginous end plates, themselves, was  then taken down to expose the raw bone of the end plate of the sacrum, and  the inferior margin of the body of L5. Once this was accomplished,  dissection into the disk space was further accomplished by decorticating the  bone adequately. The release of the disk space allowed for some reduction  and this was witnessed by the x-ray. It was felt at this point that a slight  distraction would benefit in some of the  reduction and an 8 mm interbody  spacer was placed into the interspace. Attention was then turned to  placement of the pedicle screws. This was done with fluoroscopic guidance to  choose pedicle entry sites at L4, L5, and the sacrum first on the right  side, then on the left side. At L4, 6.5 x 45 mm screws were drilled and  tapped sequentially and placed into the vertebra. At L5, 6.5 x 40 mm screws  were placed and in the sacrum, 6.5 x 40 mm sacral screws were placed after  being tapped and sounding each of the holes to  make sure there was no  evidence of any cut out.  Again, radiographic confirmation of each of the  individual screw placements was obtained. Once this was achieved, bone graft  was harvested from the right posterior superior iliac crest through a  separate fascial incision created by dissecting the subcutaneous tissues  away from the ileal gluteal fascia. The  fascia was opened on the gluteal  side and the gluteus was dissected away from the outer table of the ileum  and then a combination of osteotomes and gouges was used to harvest first  cortical cancellous strips of bone followed by cancellus bone from the  undersurface of the outer table of the ileum. When adequate sample ob bone  was obtained, the gluteal fascia was reapproximated to the ileal gluteal  edge with interrupted #1 Vicryl. Care was taken to make sure any packing's  had been removed prior to closure. When a good closure was obtained,  attention was turned back to the midline operation where the interbody  spacer was then measured and a 10 mm interbody spacer was chosen and tamped  ventrally along the mid portion of the vertebral body between L5 and S1.  This was packed with the patient's autologous bone that was obtained from  the recent iliac crest bone graft plus the bone that was obtained from the  dissection of the lamina. Once this bone graft was in place, the interspace was then filled with the rest  of the remaining cortical cancellous bone that  was obtained from the iliac crest bone graft. The lateral gutters were then  packed from L4 to the sacrum. Care was taken to make sure that each of the  spaces was decorticated adequately and care was also taken to place first  the cancellus bone followed by the corticocancellous strips overlying this.  The pedicle screws were then connected with 60 mm rods which were  additionally contoured to fit nicely into the intervertebral spaces.  L4 and  L5 were fixed neutrally. L5 and S1 was placed in slight compression. A  transverse connector was placed between L5 and S1. Then, hemostasis was  checked cautiously in the intermuscular beds and the wound was irrigated  copiously with antibiotic irrigating solution and then the lumbodorsal  fascia was reapproximated with #1 Vicryl interrupted fashion, 2-0 Vicryl in  the subcutaneous tissues, 3-0 Vicryl subcuticularly. Dermabond was used on  the skin, 500 mL of blood loss was estimated. The wound was infiltrated with  a total of 30 mL of Marcaine at the end of the procedure in both lateral  muscular beds and over the ileal gluteal site to allow for good  postoperative analgesia. The patient tolerated procedure well. He was given  150 mL of Cell Saver blood back through the IV, and returned to the recovery  room in stable condition.      Stefani Dama, M.D.  Electronically Signed     HJE/MEDQ  D:  12/20/2004  T:  12/21/2004  Job:  [id number]

## 2010-11-13 ENCOUNTER — Other Ambulatory Visit: Payer: Self-pay | Admitting: Internal Medicine

## 2011-02-22 ENCOUNTER — Other Ambulatory Visit: Payer: Self-pay | Admitting: Internal Medicine

## 2011-05-27 ENCOUNTER — Telehealth: Payer: Self-pay | Admitting: *Deleted

## 2011-05-27 MED ORDER — LEVOTHYROXINE SODIUM 25 MCG PO TABS: ORAL_TABLET | ORAL | Status: DC

## 2011-05-27 NOTE — Telephone Encounter (Signed)
Refill on synthroid

## 2011-06-28 ENCOUNTER — Ambulatory Visit (INDEPENDENT_AMBULATORY_CARE_PROVIDER_SITE_OTHER): Payer: Commercial Managed Care - PPO | Admitting: Internal Medicine

## 2011-06-28 ENCOUNTER — Encounter: Payer: Self-pay | Admitting: Internal Medicine

## 2011-06-28 VITALS — BP 110/70 | HR 72 | Ht 64.5 in | Wt 132.0 lb

## 2011-06-28 DIAGNOSIS — Z9889 Other specified postprocedural states: Secondary | ICD-10-CM

## 2011-06-28 DIAGNOSIS — E039 Hypothyroidism, unspecified: Secondary | ICD-10-CM

## 2011-06-28 DIAGNOSIS — Q909 Down syndrome, unspecified: Secondary | ICD-10-CM

## 2011-06-28 DIAGNOSIS — L218 Other seborrheic dermatitis: Secondary | ICD-10-CM

## 2011-06-28 DIAGNOSIS — Z0000 Encounter for general adult medical examination without abnormal findings: Secondary | ICD-10-CM

## 2011-06-28 DIAGNOSIS — L219 Seborrheic dermatitis, unspecified: Secondary | ICD-10-CM

## 2011-06-28 LAB — CBC WITH DIFFERENTIAL/PLATELET
Basophils Absolute: 0.1 10*3/uL (ref 0.0–0.1)
HCT: 46.2 % (ref 39.0–52.0)
Hemoglobin: 15.4 g/dL (ref 13.0–17.0)
MCHC: 33.4 g/dL (ref 30.0–36.0)
MCV: 102.3 fl — ABNORMAL HIGH (ref 78.0–100.0)
Monocytes Absolute: 0.5 10*3/uL (ref 0.1–1.0)
Monocytes Relative: 8.8 % (ref 3.0–12.0)
RBC: 4.52 Mil/uL (ref 4.22–5.81)
RDW: 12.8 % (ref 11.5–14.6)
WBC: 5.9 10*3/uL (ref 4.5–10.5)

## 2011-06-28 LAB — HEPATIC FUNCTION PANEL
AST: 20 U/L (ref 0–37)
Alkaline Phosphatase: 74 U/L (ref 39–117)
Total Protein: 7.7 g/dL (ref 6.0–8.3)

## 2011-06-28 LAB — BASIC METABOLIC PANEL
BUN: 14 mg/dL (ref 6–23)
CO2: 31 mEq/L (ref 19–32)
Chloride: 101 mEq/L (ref 96–112)
GFR: 117.85 mL/min (ref >60.00)

## 2011-06-28 LAB — LIPID PANEL
Cholesterol: 189 mg/dL (ref 0–200)
HDL: 38.1 mg/dL — ABNORMAL LOW (ref >39.00)
Triglycerides: 142 mg/dL (ref 0.0–149.0)

## 2011-06-28 NOTE — Progress Notes (Signed)
  Subjective:    Patient ID: Roy Carr, male    DOB: 1986-02-10, 26 y.o.   MRN: [id number]  HPI Patient comes in today for preventive visit and follow-up of medical issues. He is here with his mom and his sister. Update  history since  last visit: He has been doing very well has lost some weight since last year. Is no longer in high school but is still working 5 days a week at [employer]  He has had no major changes in his health has seen the dentist and his braces are off and has a permanent retainer. His sleep is good without significant snoring. He still battles some dandruff in the ketoconazole does not work as well anymore. He is using some type of Lac-Hydrin for his dry skin which has been helpful from his dermatologist.  His mom thinks he is doing well he takes thyroid medicine every day he is due for laboratory studies.     Review of Systems Recently had a cold but no known complaints of chest pain shortness of breath swelling of falling bruising major changes in his hearing or vision. Rest as per history of present illness Wynelle Link protection  gets an occasional tremor in the hand no significant caffeine. Past history family history social history reviewed in the electronic medical record. 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 Brings his own lunch doesn't snack at work     Objective:   Physical Exam Wt Readings from Last 3 Encounters:  06/28/11 132 lb (59.875 kg)  06/22/10 140 lb (63.504 kg)  06/16/09 152 lb (68.947 kg)  WDWN downs featured pleasant WM in nad  HEENT: microcephalic scalp flaky no rash  Eyes perrl nares patent teeth in good repair  Neck: Supple without adenopathy or masses or bruits Chest:  Clear to A&P without wheezes rales or rhonchi CV:  S1-S2 no gallops or murmurs peripheral perfusion is normal Abdomen:  Sof,t normal bowel sounds without hepatosplenomegaly, no guarding rebound or masses no CVA tenderness EXT  GU nl male test down bilaterally  LN: no cervical axillary inguinal adenopathy No clubbing cyanosis or edema minimal VV seen now  NEURO non vocal in converstation follows direction well and cooperative  And pleasant. MS no acute changes  Downs features  Back good rom.  Skin: normal capillary refill ,turgor , color: No acute rashes ,petechiae or bruising dry patches  Scalp with dandruff  No acute changes       Assessment & Plan:  Preventive Health Care Has good weight and bmi now   Follow  If getting too much weight loss then call for fu.  Hypothyroid  Labs today no change in meds so far   Seborrheic dandruff can disc with Dr Yetta Barre but could   Try dandruff shampoo .

## 2011-06-28 NOTE — Patient Instructions (Signed)
Will notify you  of labs when available. Still may benefit some with antidandruff shampoos such as  head and shoulders intensive  Some have conditioners in it to keep from drying .   Continue same med for now.   Yearly visit  Or as appropriate.

## 2011-07-01 ENCOUNTER — Encounter: Payer: Self-pay | Admitting: *Deleted

## 2011-07-01 NOTE — Progress Notes (Signed)
Quick Note:  Letter sent to mom ______

## 2011-08-23 ENCOUNTER — Other Ambulatory Visit: Payer: Self-pay

## 2011-08-23 MED ORDER — LEVOTHYROXINE SODIUM 25 MCG PO TABS: 25.0000 ug | ORAL_TABLET | Freq: Every day | ORAL | Status: DC

## 2011-08-23 NOTE — Telephone Encounter (Signed)
Rx sent to pharmacy for synthroid.   

## 2011-09-03 ENCOUNTER — Other Ambulatory Visit: Payer: Self-pay

## 2011-09-03 MED ORDER — LEVOTHYROXINE SODIUM 25 MCG PO TABS: ORAL_TABLET | ORAL | Status: DC

## 2011-09-03 NOTE — Telephone Encounter (Signed)
Rx had incorrect directions.  Rx corrected and resent to pharmacy.

## 2011-11-18 ENCOUNTER — Other Ambulatory Visit: Payer: Self-pay | Admitting: Internal Medicine

## 2011-11-18 MED ORDER — SULFACETAMIDE SODIUM 10 % OP SOLN: 2.0000 [drp] | OPHTHALMIC | Status: AC

## 2011-11-18 MED ORDER — BACITRACIN-POLYMYXIN B 500-10000 UNIT/GM OP OINT: TOPICAL_OINTMENT | Freq: Two times a day (BID) | OPHTHALMIC | Status: AC

## 2012-01-08 ENCOUNTER — Encounter: Payer: Self-pay | Admitting: *Deleted

## 2012-01-31 ENCOUNTER — Encounter: Payer: Self-pay | Admitting: Internal Medicine

## 2012-05-15 ENCOUNTER — Telehealth: Payer: Self-pay | Admitting: Internal Medicine

## 2012-05-15 NOTE — Telephone Encounter (Signed)
Pt aware.

## 2012-05-15 NOTE — Telephone Encounter (Signed)
Pt's mom would like appt for physical on March 27 or 28 in am if possible. Mom is off on these days. Pt will come week prior for labs.

## 2012-05-15 NOTE — Telephone Encounter (Signed)
Ok to  Add on  Either of those days

## 2012-07-03 ENCOUNTER — Ambulatory Visit (INDEPENDENT_AMBULATORY_CARE_PROVIDER_SITE_OTHER): Payer: Commercial Managed Care - PPO | Admitting: Internal Medicine

## 2012-07-03 ENCOUNTER — Encounter: Payer: Self-pay | Admitting: Internal Medicine

## 2012-07-03 VITALS — BP 104/72 | HR 65 | Temp 97.8°F | Ht 64.5 in | Wt 135.0 lb

## 2012-07-03 DIAGNOSIS — L219 Seborrheic dermatitis, unspecified: Secondary | ICD-10-CM

## 2012-07-03 DIAGNOSIS — L218 Other seborrheic dermatitis: Secondary | ICD-10-CM

## 2012-07-03 DIAGNOSIS — H269 Unspecified cataract: Secondary | ICD-10-CM

## 2012-07-03 DIAGNOSIS — Q909 Down syndrome, unspecified: Secondary | ICD-10-CM

## 2012-07-03 DIAGNOSIS — Z0000 Encounter for general adult medical examination without abnormal findings: Secondary | ICD-10-CM

## 2012-07-03 DIAGNOSIS — B009 Herpesviral infection, unspecified: Secondary | ICD-10-CM

## 2012-07-03 DIAGNOSIS — B001 Herpesviral vesicular dermatitis: Secondary | ICD-10-CM

## 2012-07-03 DIAGNOSIS — E039 Hypothyroidism, unspecified: Secondary | ICD-10-CM

## 2012-07-03 LAB — HEPATIC FUNCTION PANEL
ALT: 27 U/L (ref 0–53)
Total Protein: 7.3 g/dL (ref 6.0–8.3)

## 2012-07-03 LAB — BASIC METABOLIC PANEL
BUN: 15 mg/dL (ref 6–23)
CO2: 28 mEq/L (ref 19–32)
Chloride: 104 mEq/L (ref 96–112)
Glucose, Bld: 87 mg/dL (ref 70–99)
Potassium: 4.5 mEq/L (ref 3.5–5.1)
Sodium: 138 mEq/L (ref 135–145)

## 2012-07-03 LAB — CBC WITH DIFFERENTIAL/PLATELET
Basophils Absolute: 0.1 10*3/uL (ref 0.0–0.1)
Eosinophils Absolute: 0 10*3/uL (ref 0.0–0.7)
Lymphocytes Relative: 29.2 % (ref 12.0–46.0)
MCV: 100.7 fl — ABNORMAL HIGH (ref 78.0–100.0)
Platelets: 225 10*3/uL (ref 150.0–400.0)
RBC: 4.43 Mil/uL (ref 4.22–5.81)
RDW: 13.1 % (ref 11.5–14.6)
WBC: 5 10*3/uL (ref 4.5–10.5)

## 2012-07-03 LAB — LIPID PANEL
Cholesterol: 202 mg/dL — ABNORMAL HIGH (ref 0–200)
Total CHOL/HDL Ratio: 6

## 2012-07-03 MED ORDER — LEVOTHYROXINE SODIUM 25 MCG PO TABS: ORAL_TABLET | ORAL | Status: DC

## 2012-07-03 MED ORDER — VALACYCLOVIR HCL 1 G PO TABS: 2000.0000 mg | ORAL_TABLET | Freq: Two times a day (BID) | ORAL | Status: DC

## 2012-07-03 NOTE — Progress Notes (Signed)
Chief Complaint  Patient presents with  . Annual Exam    Has a SCAT form to fill out.    HPI: Patient comes in today for Preventive Health Care visit  Here with mom   no major changes in his health since last year. No injuries mom is not concerned although he has battled significant dandruff of the scalp used to get better with ketoconazole but not anymore. Using a tar shampoo. Getting cold sores has had 3 in the last year using topicals Taking branded thyroid medication every day no concerns palpitations.  Dental braces are off stable Eye gets eye checks glasses may have early cataracts that are normal for his situation. No major injuries he is still working at [employer] doing well  ROS:  GEN/ HEENT: No fever, significant weight changes sweats headaches vision problems hearing changes, CV/ PULM; No chest pain shortness of breath cough, syncope,edema  change in exercise tolerance. GI /GU: No adominal pain, vomiting, change in bowel habits. No blood in the stool. No significant GU symptoms. SKIN/HEME: ,no acute skin rashes suspicious lesions or bleeding. No lymphadenopathy, nodules, masses.  NEURO/ PSYCH:  No neurologic signs such as weakness numbness. No depression anxiety. IMM/ Allergy: No unusual infections.  Allergy .  No REST of 12 system review negative except as per HPI   Past Medical History  Diagnosis Date  . Trisomy 21   . Spondylisthesis     grade 2 corrected  . Hypothyroid     prev per Dr Fransico Michael   . Varicose veins     intervention 5 2011  . Personal history of spine surgery     Family History  Problem Relation Age of Onset  . Melanoma Father     History   Social History  . Marital Status: Single    Spouse Name: N/A    Number of Children: N/A  . Years of Education: N/A   Social History Main Topics  . Smoking status: Never Smoker   . Smokeless tobacco: None  . Alcohol Use: No  . Drug Use: None  . Sexually Active: None   Other Topics Concern  . 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 levothyroxine (SYNTHROID, LEVOTHROID) 25 MCG tablet Take 1 1/2 tablet daily.  Brand Medically Ness.  45 tablet  12  . [DISCONTINUED] levothyroxine (SYNTHROID, LEVOTHROID) 25 MCG tablet Take 1 1/2 tablet daily.  Brand Medically Ness.  135 tablet  1  . valACYclovir (VALTREX) 1000 MG tablet Take 2 tablets (2,000 mg total) by mouth 2 (two) times daily. For cold sores  30 tablet  1  . [DISCONTINUED] ketoconazole (NIZORAL) 2 % shampoo Apply topically 2 (two) times a week.  120 mL  11   No facility-administered encounter medications on file as of 07/03/2012.    EXAM:  BP 104/72  Pulse 65  Temp(Src) 97.8 F (36.6 C) (Oral)  Ht 5' 4.5" (1.638 m)  Wt 135 lb (61.236 kg)  BMI 22.82 kg/m2  SpO2 99%  Body  mass index is 22.82 kg/(m^2). Wt Readings from Last 3 Encounters:  07/03/12 135 lb (61.236 kg)  06/28/11 132 lb (59.875 kg)  06/22/10 140 lb (63.504 kg)    Physical Exam: Vital signs reviewed WUJ:WJXB is a well-developed well-nourished alert  Delightful cooperative  who appears  stated age in no acute distress. Typical Down's features is pleasant interactive slightly verbal. Mom helps with erections. HEENT:atraumatic , Eyes: PERRL EOM's full, conjunctiva clear, Nares: paten,t no deformity discharge or tenderness., Ears:  EAC's clear TMs with normal landmarks. Mouth: clear OP, no lesions, edema.  Moist mucous membranes. Dentition in adequate repair. NECK: supple without masses, thyromegaly or bruits. CHEST/PULM:  Clear to auscultation and percussion breath sounds equal no wheeze , rales or rhonchi. No chest wall  deformities or tenderness. CV: PMI is nondisplaced, S1 S2 no gallops, murmurs, rubs. Peripheral pulses are full without delay.No JVD .  ABDOMEN: Bowel sounds normal nontender  No guard or rebound, no hepato splenomegal no CVA tenderness.  No hernia. Extremtities:  No clubbing cyanosis or edema, no acute joint swelling or redness no focal atrophy Downs digits back healed surgical scar. NEURO:   cranial nerves 3-12 appear to be intact, no obvious focal weakness,gait within normal limits no abnormal reflexes or asymmetrical SKIN: No acute rashes normal turgor, color, no bruising or petechiae. Dry skin scalp some scaling feeding cold sore right lower lip no unusual bruising   LN: no cervical axillary inguinal adenopathy  Lab Results  Component Value Date   WBC 5.9 06/28/2011   HGB 15.4 06/28/2011   HCT 46.2 06/28/2011   PLT 248.0 06/28/2011   GLUCOSE 84 06/28/2011   CHOL 189 06/28/2011   TRIG 142.0 06/28/2011   HDL 38.10* 06/28/2011   LDLDIRECT 159.7 06/22/2010   LDLCALC 123* 06/28/2011   ALT 21 06/28/2011   AST 20 06/28/2011   NA 139 06/28/2011   K 4.2 06/28/2011   CL 101 06/28/2011   CREATININE 0.9 06/28/2011   BUN 14 06/28/2011   CO2 31 06/28/2011   TSH 2.06 06/28/2011    ASSESSMENT AND PLAN:  Discussed the following assessment and plan:  Visit for preventive health examination - Up to date - Plan: Lipid panel, Hepatic function panel, CBC with Differential, Basic metabolic panel, TSH  HYPOTHYROIDISM - Continue branded medicine TSH today - Plan: Lipid panel, Hepatic function panel, CBC with Differential, Basic metabolic panel, TSH  Trisomy 21 - Surveillance lab work CBCs - Plan: Lipid panel, Hepatic function panel, CBC with Differential, Basic metabolic panel, TSH  Recurrent cold sores  Seborrheic dermatitis of scalp - Key econazole not working anymore mom using baby oil in tar shampoo can check with derm to see if other ideas would not adv oral medsat this time  Early cataracts, bilateral -  per [facility] SCAT form signed .  Patient Care Team: Madelin Headings, MD as PCP - General Chucky May, MD as Attending Physician (Ophthalmology) Patient Instructions  Continue lifestyle intervention healthy eating and exercise . Will notify you  of labs when available.   Preventive med check in a year or as needed.  Will let you know if another ideas about the dandruff ;seborrheic condition.    Neta Mends. Truth Wolaver M.D.

## 2012-07-03 NOTE — Patient Instructions (Signed)
Continue lifestyle intervention healthy eating and exercise . Will notify you  of labs when available.   Preventive med check in a year or as needed.  Will let you know if another ideas about the dandruff ;seborrheic condition.

## 2013-02-09 ENCOUNTER — Encounter: Payer: Self-pay | Admitting: Internal Medicine

## 2013-03-05 ENCOUNTER — Telehealth: Payer: Self-pay | Admitting: Internal Medicine

## 2013-03-05 ENCOUNTER — Other Ambulatory Visit: Payer: Self-pay | Admitting: Internal Medicine

## 2013-03-05 NOTE — Telephone Encounter (Signed)
Pt needs refill of valACYclovir (VALTREX) 1000 MG tablet [facility]/[address] [facility] made appt/  Was told pt needed one to get this refill.  Is it ok for pt to use the wellchild visit on 07/09/13? Mom works Herbalist, Engineer, petroleum.wed and pt needs early appt to come in fasting.

## 2013-03-08 ENCOUNTER — Other Ambulatory Visit: Payer: Self-pay | Admitting: Family Medicine

## 2013-03-08 MED ORDER — VALACYCLOVIR HCL 1 G PO TABS: 2000.0000 mg | ORAL_TABLET | Freq: Two times a day (BID) | ORAL | Status: DC

## 2013-03-08 NOTE — Telephone Encounter (Signed)
Medication sent to the pharmacy.  That appt will be fine.  Thanks!

## 2013-07-08 ENCOUNTER — Encounter: Payer: Self-pay | Admitting: Internal Medicine

## 2013-07-08 ENCOUNTER — Ambulatory Visit (INDEPENDENT_AMBULATORY_CARE_PROVIDER_SITE_OTHER): Payer: 59 | Admitting: Internal Medicine

## 2013-07-08 VITALS — BP 92/60 | HR 60 | Temp 97.6°F | Ht 64.5 in | Wt 142.0 lb

## 2013-07-08 DIAGNOSIS — Q909 Down syndrome, unspecified: Secondary | ICD-10-CM

## 2013-07-08 DIAGNOSIS — Z0000 Encounter for general adult medical examination without abnormal findings: Secondary | ICD-10-CM

## 2013-07-08 DIAGNOSIS — E786 Lipoprotein deficiency: Secondary | ICD-10-CM

## 2013-07-08 DIAGNOSIS — R21 Rash and other nonspecific skin eruption: Secondary | ICD-10-CM

## 2013-07-08 DIAGNOSIS — E039 Hypothyroidism, unspecified: Secondary | ICD-10-CM

## 2013-07-08 LAB — HEPATIC FUNCTION PANEL
ALK PHOS: 74 U/L (ref 39–117)
ALT: 25 U/L (ref 0–53)
AST: 21 U/L (ref 0–37)
Albumin: 4.2 g/dL (ref 3.5–5.2)
BILIRUBIN DIRECT: 0.1 mg/dL (ref 0.0–0.3)
BILIRUBIN TOTAL: 1.4 mg/dL — AB (ref 0.3–1.2)
TOTAL PROTEIN: 7.4 g/dL (ref 6.0–8.3)

## 2013-07-08 LAB — LIPID PANEL
CHOL/HDL RATIO: 6
Cholesterol: 221 mg/dL — ABNORMAL HIGH (ref 0–200)
HDL: 35.8 mg/dL — AB (ref >39.00)
LDL Cholesterol: 150 mg/dL — ABNORMAL HIGH (ref 0–99)
TRIGLYCERIDES: 175 mg/dL — AB (ref 0.0–149.0)
VLDL: 35 mg/dL (ref 0.0–40.0)

## 2013-07-08 LAB — CBC WITH DIFFERENTIAL/PLATELET
BASOS PCT: 1.5 % (ref 0.0–3.0)
Basophils Absolute: 0.1 10*3/uL (ref 0.0–0.1)
EOS ABS: 0 10*3/uL (ref 0.0–0.7)
EOS PCT: 0.7 % (ref 0.0–5.0)
HCT: 45.3 % (ref 39.0–52.0)
Hemoglobin: 15.5 g/dL (ref 13.0–17.0)
LYMPHS PCT: 29.1 % (ref 12.0–46.0)
Lymphs Abs: 1.5 10*3/uL (ref 0.7–4.0)
MCHC: 34.2 g/dL (ref 30.0–36.0)
MCV: 102.5 fl — ABNORMAL HIGH (ref 78.0–100.0)
Monocytes Absolute: 0.5 10*3/uL (ref 0.1–1.0)
Monocytes Relative: 10 % (ref 3.0–12.0)
NEUTROS PCT: 58.7 % (ref 43.0–77.0)
Neutro Abs: 3.1 10*3/uL (ref 1.4–7.7)
Platelets: 226 10*3/uL (ref 150.0–400.0)
RBC: 4.42 Mil/uL (ref 4.22–5.81)
RDW: 13.3 % (ref 11.5–14.6)
WBC: 5.2 10*3/uL (ref 4.5–10.5)

## 2013-07-08 LAB — BASIC METABOLIC PANEL
BUN: 13 mg/dL (ref 6–23)
CALCIUM: 9.5 mg/dL (ref 8.4–10.5)
CO2: 28 mEq/L (ref 19–32)
CREATININE: 0.8 mg/dL (ref 0.4–1.5)
Chloride: 103 mEq/L (ref 96–112)
GFR: 126.13 mL/min (ref >60.00)
Glucose, Bld: 84 mg/dL (ref 70–99)
Potassium: 4 mEq/L (ref 3.5–5.1)
Sodium: 137 mEq/L (ref 135–145)

## 2013-07-08 LAB — TSH: TSH: 3.02 u[IU]/mL (ref 0.35–5.50)

## 2013-07-08 NOTE — Patient Instructions (Signed)
Continue with skin care  Will do letter for documentation of disability  for insurance coverage. Will notify you  of labs when available. Try antifungal in scrotal are  Miconazole ,clotrimazole of lamisil for 2 weeks to see if fades and then until better . If not let us know.  ? If tinea infection.  Yearly check or as needed

## 2013-07-08 NOTE — Progress Notes (Signed)
Pre visit review using our clinic review tool, if applicable. No additional management support is needed unless otherwise documented below in the visit note.   Chief Complaint  Patient presents with  . Annual Exam    HPI: Patient comes in today for Preventive Health Care visit  Here with mom  Since last visit no major changes in his health injuries hospitalizations or surgeries. Skin still very dry some better: Using lachydrin 12 doing well  Target dandruf conditioner and shanpoo with carrots works beter for scalp. Her couple areas in his left scrotal area that are red and getting worse over the last weeks. Please check Thyroid no change  No bruising or bleeding Health Maintenance  Topic Date Due  . Influenza Vaccine  11/13/2013  . Tetanus/tdap  03/03/2017   Health Maintenance Review  Sleep is good still working at [employer]:  GEN/ HEENT: No fever, significant weight changes sweats headaches vision problems hearing changes, it's eye checks CV/ PULM; No chest pain shortness of breath cough, syncope,edema  change in exercise tolerance. GI /GU: No adominal pain, vomiting, change in bowel habits. No blood in the stool. No significant GU symptoms. SKIN/HEME: ,no acute skin rashes suspicious lesions or bleeding. No lymphadenopathy, nodules, masses.  NEURO/ PSYCH:  No New neurologic signs such as weakness numbness. No depression anxiety. IMM/ Allergy: No unusual infections.  Allergy .   REST of 12 system review negative except as per HPI   Past Medical History  Diagnosis Date  . Trisomy 21   . Spondylisthesis     grade 2 corrected  . Hypothyroid     prev per Dr Tobe Sos   . Varicose veins     intervention 5 2011  . Personal history of spine surgery     Family History  Problem Relation Age of Onset  . Melanoma Father     History   Social History  . Marital Status: Single    Spouse Name: N/A    Number of Children: N/A  . Years of Education: N/A   Social History  Main Topics  . Smoking status: Never Smoker   . Smokeless tobacco: None  . Alcohol Use: No  . Drug Use: None  . Sexual Activity: None   Other Topics Concern  . 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 levothyroxine (SYNTHROID, LEVOTHROID) 25 MCG tablet Take 1 1/2 tablet daily.  Brand Medically Ness.  . valACYclovir (VALTREX) 1000 MG tablet Take 2 tablets (2,000 mg total) by mouth 2 (two) times daily. For cold sores    EXAM:  BP 92/60  Pulse 60  Temp(Src) 97.6 F (36.4 C) (Temporal)  Ht 5' 4.5" (1.638 m)  Wt 142 lb (64.411 kg)  BMI 24.01 kg/m2  SpO2 99%  Body mass index is 24.01 kg/(m^2).  Physical Exam: Vital signs reviewed [id number] is a well-developed well-nourished alert cooperative    who appearsr stated age in no acute distress. Features typical of trisomy smiles interacts HEENT: Microcephalic atraumatic , Eyes: PERRL EOM's full,  conjunctiva clear, Nares: paten,t no deformity discharge or tenderness., Ears: no deformity EAC's clear TMs with normal landmarks. Mouth: clear OP, no lesions, edema.  Moist mucous membranes. Dentition in adequate repair. NECK: supple without masses,or bruits. CHEST/PULM:  Clear to auscultation and percussion breath sounds equal no wheeze , rales or rhonchi. No chest wall deformities or tenderness. CV: PMI is nondisplaced, S1 S2 no gallops, murmurs, rubs. Peripheral pulses are full without delay.No JVD .  ABDOMEN: Bowel sounds normal nontender  No guard or rebound, no hepato splenomegal no CVA tenderness.  No hernia Extremtities:  No clubbing cyanosis or edema, no acute joint swelling or redness no focal atrophy typical  habitus Downs External GU normal male NEURO:  Oriented x3, cranial nerves 3-12 appear to be intact, no obvious focal weakness,gait within normal limits no abnormal reflexes or asymmetrical SKIN: normal turgor flaky , color, no bruising or petechiae.  Scrotal area left ovoid red patches  ? If central clearing  PSYCH: alert  good eye contact,  LN: no cervical axillary inguinal adenopathy  ASSESSMENT AND PLAN:  Discussed the following assessment and plan:  Visit for preventive health examination - Plan: Basic metabolic panel, CBC with Differential, Hepatic function panel, Lipid panel, TSH  Unspecified hypothyroidism - Plan: Basic metabolic panel, CBC with Differential, Hepatic function panel, Lipid panel, TSH  Trisomy 21 - Plan: Basic metabolic panel, CBC with Differential, Hepatic function panel, Lipid panel, TSH  Rash and nonspecific skin eruption - poss tinea.  - Plan: Basic metabolic panel, CBC with Differential, Hepatic function panel, Lipid panel, TSH  Low HDL (under 40) - Plan: Basic metabolic panel, CBC with Differential, Hepatic function panel, Lipid panel, TSH Will do letter also  Patient Care Team: Burnis Medin, MD as PCP - General Linton Rump, MD as Attending Physician (Ophthalmology) Patient Instructions  Continue with skin care  Will do letter for documentation of disability  for insurance coverage. Will notify you  of labs when available. Try antifungal in scrotal are  Miconazole ,clotrimazole of lamisil for 2 weeks to see if fades and then until better . If not let us know.  ? If tinea infection.  Yearly check or as needed   Standley Brooking. Panosh M.D.

## 2013-07-19 ENCOUNTER — Other Ambulatory Visit: Payer: Self-pay | Admitting: Internal Medicine

## 2014-04-19 ENCOUNTER — Telehealth: Payer: Self-pay | Admitting: Internal Medicine

## 2014-04-19 NOTE — Telephone Encounter (Signed)
Pt needs a cpe in march/april.  Mom would like to know if you could work him in on thurs or fridays, first am appt?  OK to schedule either day?

## 2014-04-19 NOTE — Telephone Encounter (Signed)
Ok  But first appt is now 8 30 on those days

## 2014-04-20 NOTE — Telephone Encounter (Signed)
Lm on vm to cb °

## 2014-04-20 NOTE — Telephone Encounter (Signed)
Pt has been scheduled.  °

## 2014-06-30 ENCOUNTER — Other Ambulatory Visit: Payer: Self-pay | Admitting: Internal Medicine

## 2014-06-30 NOTE — Telephone Encounter (Signed)
30 day supply sent to the pharmacy by e-scribe.  Pt has an upcoming appt on 07/14/14.

## 2014-07-07 ENCOUNTER — Other Ambulatory Visit (INDEPENDENT_AMBULATORY_CARE_PROVIDER_SITE_OTHER): Payer: 59

## 2014-07-07 DIAGNOSIS — Z0000 Encounter for general adult medical examination without abnormal findings: Secondary | ICD-10-CM | POA: Diagnosis not present

## 2014-07-07 LAB — BASIC METABOLIC PANEL
BUN: 14 mg/dL (ref 6–23)
CO2: 28 mEq/L (ref 19–32)
Calcium: 9.6 mg/dL (ref 8.4–10.5)
Chloride: 104 mEq/L (ref 96–112)
Creatinine, Ser: 0.85 mg/dL (ref 0.40–1.50)
GFR: 115.02 mL/min (ref >60.00)
Glucose, Bld: 83 mg/dL (ref 70–99)
POTASSIUM: 4.2 meq/L (ref 3.5–5.1)
SODIUM: 138 meq/L (ref 135–145)

## 2014-07-07 LAB — HEPATIC FUNCTION PANEL
ALK PHOS: 80 U/L (ref 39–117)
ALT: 18 U/L (ref 0–53)
AST: 18 U/L (ref 0–37)
Albumin: 4 g/dL (ref 3.5–5.2)
BILIRUBIN TOTAL: 1.1 mg/dL (ref 0.2–1.2)
Bilirubin, Direct: 0.2 mg/dL (ref 0.0–0.3)
TOTAL PROTEIN: 7.4 g/dL (ref 6.0–8.3)

## 2014-07-07 LAB — CBC WITH DIFFERENTIAL/PLATELET
Basophils Absolute: 0.1 10*3/uL (ref 0.0–0.1)
Basophils Relative: 1.2 % (ref 0.0–3.0)
EOS ABS: 0 10*3/uL (ref 0.0–0.7)
Eosinophils Relative: 1.1 % (ref 0.0–5.0)
HCT: 45.2 % (ref 39.0–52.0)
HEMOGLOBIN: 15.9 g/dL (ref 13.0–17.0)
Lymphocytes Relative: 31.9 % (ref 12.0–46.0)
Lymphs Abs: 1.5 10*3/uL (ref 0.7–4.0)
MCHC: 35.1 g/dL (ref 30.0–36.0)
MCV: 99.4 fl (ref 78.0–100.0)
Monocytes Absolute: 0.4 10*3/uL (ref 0.1–1.0)
Monocytes Relative: 9.3 % (ref 3.0–12.0)
NEUTROS PCT: 56.5 % (ref 43.0–77.0)
Neutro Abs: 2.6 10*3/uL (ref 1.4–7.7)
Platelets: 227 10*3/uL (ref 150.0–400.0)
RBC: 4.55 Mil/uL (ref 4.22–5.81)
RDW: 13.6 % (ref 11.5–15.5)
WBC: 4.6 10*3/uL (ref 4.0–10.5)

## 2014-07-07 LAB — LIPID PANEL
Cholesterol: 208 mg/dL — ABNORMAL HIGH (ref 0–200)
HDL: 37 mg/dL — ABNORMAL LOW (ref >39.00)
LDL Cholesterol: 139 mg/dL — ABNORMAL HIGH (ref 0–99)
NonHDL: 171
TRIGLYCERIDES: 159 mg/dL — AB (ref 0.0–149.0)
Total CHOL/HDL Ratio: 6
VLDL: 31.8 mg/dL (ref 0.0–40.0)

## 2014-07-07 LAB — TSH: TSH: 2.7 u[IU]/mL (ref 0.35–4.50)

## 2014-07-14 ENCOUNTER — Ambulatory Visit (INDEPENDENT_AMBULATORY_CARE_PROVIDER_SITE_OTHER): Payer: 59 | Admitting: Internal Medicine

## 2014-07-14 ENCOUNTER — Encounter: Payer: Self-pay | Admitting: Internal Medicine

## 2014-07-14 VITALS — BP 100/70 | Temp 98.1°F | Ht 64.0 in | Wt 142.0 lb

## 2014-07-14 DIAGNOSIS — E039 Hypothyroidism, unspecified: Secondary | ICD-10-CM

## 2014-07-14 DIAGNOSIS — E786 Lipoprotein deficiency: Secondary | ICD-10-CM | POA: Diagnosis not present

## 2014-07-14 DIAGNOSIS — Q909 Down syndrome, unspecified: Secondary | ICD-10-CM | POA: Diagnosis not present

## 2014-07-14 DIAGNOSIS — Z0000 Encounter for general adult medical examination without abnormal findings: Secondary | ICD-10-CM

## 2014-07-14 MED ORDER — VALACYCLOVIR HCL 1 G PO TABS: ORAL_TABLET | ORAL | Status: DC

## 2014-07-14 NOTE — Progress Notes (Signed)
Pre visit review using our clinic review tool, if applicable. No additional management support is needed unless otherwise documented below in the visit note.  Chief Complaint  Patient presents with  . Annual Exam    HPI: Patient  Roy Carr  29 y.o. comes in today for Preventive Health Care visit  Down s syndrome will working 5 days a week at [employer] . Swims in [employer] o[employer]ics . No gi gu resp cv sx . Dental and eye check early cataracts no change.  Skin issues dry folliculitis  And using.   lachydrin prep with help. 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   Health Maintenance  Topic Date Due  . HIV Screening  08/20/2002  . INFLUENZA VACCINE  11/14/2014  . TETANUS/TDAP  03/03/2017   Health Maintenance Review LIFESTYLE:  Exercise:   Swim walk  Tobacco/ETS:no Alcohol: per day no Sugar beverages:no Sleep:ok no osa sx   ROS:  GEN/ HEENT: No fever, significant weight changes sweats headaches vision problems hearing changes, CV/ PULM; No chest pain shortness of breath cough, syncope,edema  change in exercise tolerance. GI /GU: No adominal pain, vomiting, change in bowel habits. No blood in the stool. No significant GU symptoms. SKIN/HEME: ,no acute skin rashes suspicious lesions or bleeding. No lymphadenopathy, nodules, masses.  NEURO/ PSYCH:  No neurologic signs such as weakness numbness. No depression anxiety. IMM/ Allergy: No unusual infections.  Allergy .   REST of 12 system review negative except as per HPI   Past Medical History  Diagnosis Date  . Trisomy 21   . Spondylisthesis     grade 2 corrected  . Hypothyroid     prev per Dr Tobe Sos   . Varicose veins     intervention 5 2011  . Personal history of spine surgery     Past Surgical History  Procedure Laterality Date  . Spine surgery    . Wisdom tooth extraction    . Ablation saphenous vein w/ rfa      Family History  Problem Relation Age of Onset  . Melanoma Father      History   Social History  . Marital Status: Single    Spouse Name: N/A  . Number of Children: N/A  . Years of Education: N/A   Social History Main Topics  . Smoking status: Never Smoker   . Smokeless tobacco: Not on file  . Alcohol Use: No  . Drug Use: Not on file  . Sexual Activity: Not on file   Other Topics Concern  . 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 SYNTHROID 25 MCG tablet TAKE 1 & 1/2 TABLETS BY MOUTH ONCE DAILY  . valACYclovir (VALTREX) 1000 MG tablet TAKE 2 TABLETS BY MOUTH TWICE DAILY FOR COLD SORES  . [DISCONTINUED] valACYclovir (VALTREX) 1000 MG tablet TAKE 2 TABLETS BY MOUTH TWICE DAILY FOR COLD SORES    EXAM:  BP 100/70 mmHg  Temp(Src) 98.1 F (36.7 C) (Oral)  Ht 5\' 4"  (  1.626 m)  Wt 142 lb (64.411 kg)  BMI 24.36 kg/m2  Body mass index is 24.36 kg/(m^2).  Physical Exam: Vital signs reviewed [id number]:[id number] is a well-developed well-nourished alert cooperative    who appearsr stated age in no acute distress.  Pleasant cooperative downs features  HEENT: normocephalic atraumatic , Eyes: PERRL EOM's full, conjunctiva clear, Nares: paten,t no deformity discharge or tenderness., Ears: no deformity EAC's clear TMs with normal landmarks. Mouth: clear OP, no lesions, edema.  Moist mucous membranes. Dentition in adequate repair. High arches palate  NECK: supple without masses,  or bruits. CHEST/PULM:  Clear to auscultation and percussion breath sounds equal no wheeze , rales or rhonchi. No chest wall deformities or tenderness. CV: PMI is nondisplaced, S1 S2 no gallops, murmurs, rubs. Peripheral pulses are full without delay.No JVD .   ABDOMEN: Bowel sounds normal nontender  No guard or rebound, no hepato splenomegal no CVA tenderness.   Extremtities:  No clubbing cyanosis or edema, no acute joint swelling or redness no focal atrophy downs extremities habitus  Back well healed scar  LB  NEURO:   cranial nerves 3-12 appear to be intact, no obvious focal weakness,gait within normal limits  SKIN: No acute rashes  Dry skin mild flaking otherwise normal turgor, color, no bruising or petechiae.  LN: no cervical axillary inguinal adenopathy Interactive pleasant her with mom   Lab Results  Component Value Date   WBC 4.6 07/07/2014   HGB 15.9 07/07/2014   HCT 45.2 07/07/2014   PLT 227.0 07/07/2014   GLUCOSE 83 07/07/2014   CHOL 208* 07/07/2014   TRIG 159.0* 07/07/2014   HDL 37.00* 07/07/2014   LDLDIRECT 160.4 07/03/2012   LDLCALC 139* 07/07/2014   ALT 18 07/07/2014   AST 18 07/07/2014   NA 138 07/07/2014   K 4.2 07/07/2014   CL 104 07/07/2014   CREATININE 0.85 07/07/2014   BUN 14 07/07/2014   CO2 28 07/07/2014   TSH 2.70 07/07/2014    ASSESSMENT AND PLAN:  Discussed the following assessment and plan:  Visit for preventive health examination  Trisomy 21 - no evidence sig complicaitons at this time early cataracts   Low HDL (under 40) - lipids better but still up  continue lsei mediterranean diet   Hypothyroidism, unspecified hypothyroidism type - at goal   Patient Care Team: Burnis Medin, MD as PCP - General Calvert Cantor, MD as Attending Physician (Ophthalmology) Danella Sensing, MD as Consulting Physician (Dermatology) Patient Instructions   Continue lifestyle intervention healthy eating and exercise . Continue same  Medication.       Why follow it? Research shows. . Those who follow the Mediterranean diet have a reduced risk of heart disease  . The diet is associated with a reduced incidence of Parkinson's and Alzheimer's diseases . People following the diet may have longer life expectancies and  lower rates of chronic diseases  . The Dietary Guidelines for Americans recommends the Mediterranean diet as an eating plan to promote health and prevent disease  What Is the Mediterranean Diet?  . Healthy eating plan based on typical foods and recipes of Mediterranean-style cooking . The diet is primarily a plant based diet; these foods should make up a majority of meals   Starches - Plant based foods should make up a majority of meals - They are an important sources of vitamins, minerals, energy, antioxidants, and fiber - Choose whole grains, foods high in fiber and minimally processed items  - Typical grain sources include wheat,  oats, barley, corn, brown rice, bulgar, farro, millet, polenta, couscous  - Various types of beans include chickpeas, lentils, fava beans, black beans, white beans   Fruits  Veggies - Large quantities of antioxidant rich fruits & veggies; 6 or more servings  - Vegetables can be eaten raw or lightly drizzled with oil and cooked  - Vegetables common to the traditional Mediterranean Diet include: artichokes, arugula, beets, broccoli, brussel sprouts, cabbage, carrots, celery, collard greens, cucumbers, eggplant, kale, leeks, lemons, lettuce, mushrooms, okra, onions, peas, peppers, potatoes, pumpkin, radishes, rutabaga, shallots, spinach, sweet potatoes, turnips, zucchini - Fruits common to the Mediterranean Diet include: apples, apricots, avocados, cherries, clementines, dates, figs, grapefruits, grapes, melons, nectarines, oranges, peaches, pears, pomegranates, strawberries, tangerines  Fats - Replace butter and margarine with healthy oils, such as olive oil, canola oil, and tahini  - Limit nuts to no more than a handful a day  - Nuts include walnuts, almonds, pecans, pistachios, pine nuts  - Limit or avoid candied, honey roasted or heavily salted nuts - Olives are central to the [employer] - can be eaten whole or used in a variety of dishes   Meats Protein -  Limiting red meat: no more than a few times a month - When eating red meat: choose lean cuts and keep the portion to the size of deck of cards - Eggs: approx. 0 to 4 times a week  - Fish and lean poultry: at least 2 a week  - Healthy protein sources include, chicken, [place], lean beef, lamb - Increase intake of seafood such as tuna, salmon, trout, mackerel, shrimp, scallops - Avoid or limit high fat processed meats such as sausage and bacon  Dairy - Include moderate amounts of low fat dairy products  - Focus on healthy dairy such as fat free yogurt, skim milk, low or reduced fat cheese - Limit dairy products higher in fat such as whole or 2% milk, cheese, ice cream  Alcohol - Moderate amounts of red wine is ok  - No more than 5 oz daily for women (all ages) and men older than age 55  - No more than 10 oz of wine daily for men younger than 32  Other - Limit sweets and other desserts  - Use herbs and spices instead of salt to flavor foods  - Herbs and spices common to the traditional Mediterranean Diet include: basil, bay leaves, chives, cloves, cumin, fennel, garlic, lavender, marjoram, mint, oregano, parsley, pepper, rosemary, sage, savory, sumac, tarragon, thyme   It's not just a diet, it's a lifestyle:  . The Mediterranean diet includes lifestyle factors typical of those in the region  . Foods, drinks and meals are best eaten with others and savored . Daily physical activity is important for overall good health . This could be strenuous exercise like running and aerobics . This could also be more leisurely activities such as walking, housework, yard-work, or taking the stairs . Moderation is the key; a balanced and healthy diet accommodates most foods and drinks . Consider portion sizes and frequency of consumption of certain foods   Meal Ideas & Options:  . Breakfast:  o Whole wheat toast or whole wheat English muffins with peanut butter & hard boiled egg o Steel cut oats topped with  apples & cinnamon and skim milk  o Fresh fruit: banana, strawberries, melon, berries, peaches  o Smoothies: strawberries, bananas, greek yogurt, peanut butter o Low fat greek yogurt with blueberries and granola  o Egg white omelet  with spinach and mushrooms o Breakfast couscous: whole wheat couscous, apricots, skim milk, cranberries  . Sandwiches:  o Hummus and grilled vegetables (peppers, zucchini, squash) on whole wheat bread   o Grilled chicken on whole wheat pita with lettuce, tomatoes, cucumbers or tzatziki  o Tuna salad on whole wheat bread: tuna salad made with greek yogurt, olives, red peppers, capers, green onions o Garlic rosemary lamb pita: lamb sauted with garlic, rosemary, salt & pepper; add lettuce, cucumber, greek yogurt to pita - flavor with lemon juice and black pepper  . Seafood:  o Mediterranean grilled salmon, seasoned with garlic, basil, parsley, lemon juice and black pepper o Shrimp, lemon, and spinach whole-grain pasta salad made with low fat greek yogurt  o Seared scallops with lemon orzo  o Seared tuna steaks seasoned salt, pepper, coriander topped with tomato mixture of olives, tomatoes, olive oil, minced garlic, parsley, green onions and cappers  . Meats:  o Herbed greek chicken salad with kalamata olives, cucumber, feta  o Red bell peppers stuffed with spinach, bulgur, lean ground beef (or lentils) & topped with feta   o Kebabs: skewers of chicken, tomatoes, onions, zucchini, squash  o [place] burgers: made with red onions, mint, dill, lemon juice, feta cheese topped with roasted red peppers . Vegetarian o Cucumber salad: cucumbers, artichoke hearts, celery, red onion, feta cheese, tossed in olive oil & lemon juice  o Hummus and whole grain pita points with a greek salad (lettuce, tomato, feta, olives, cucumbers, red onion) o Lentil soup with celery, carrots made with vegetable broth, garlic, salt and pepper  o Tabouli salad: parsley, bulgur, mint, scallions,  cucumbers, tomato, radishes, lemon juice, olive oil, salt and pepper.    Wt Readings from Last 3 Encounters:  07/14/14 142 lb (64.411 kg)  07/08/13 142 lb (64.411 kg)  07/03/12 135 lb (61.236 kg)   Body mass index is 24.36 kg/(m^2).       Standley Brooking. Gean Larose M.D.

## 2014-07-14 NOTE — Patient Instructions (Addendum)
Continue lifestyle intervention healthy eating and exercise . Continue same  Medication.       Why follow it? Research shows. . Those who follow the Mediterranean diet have a reduced risk of heart disease  . The diet is associated with a reduced incidence of Parkinson's and Alzheimer's diseases . People following the diet may have longer life expectancies and lower rates of chronic diseases  . The Dietary Guidelines for Americans recommends the Mediterranean diet as an eating plan to promote health and prevent disease  What Is the Mediterranean Diet?  . Healthy eating plan based on typical foods and recipes of Mediterranean-style cooking . The diet is primarily a plant based diet; these foods should make up a majority of meals   Starches - Plant based foods should make up a majority of meals - They are an important sources of vitamins, minerals, energy, antioxidants, and fiber - Choose whole grains, foods high in fiber and minimally processed items  - Typical grain sources include wheat, oats, barley, corn, brown rice, bulgar, farro, millet, polenta, couscous  - Various types of beans include chickpeas, lentils, fava beans, black beans, white beans   Fruits  Veggies - Large quantities of antioxidant rich fruits & veggies; 6 or more servings  - Vegetables can be eaten raw or lightly drizzled with oil and cooked  - Vegetables common to the traditional Mediterranean Diet include: artichokes, arugula, beets, broccoli, brussel sprouts, cabbage, carrots, celery, collard greens, cucumbers, eggplant, kale, leeks, lemons, lettuce, mushrooms, okra, onions, peas, peppers, potatoes, pumpkin, radishes, rutabaga, shallots, spinach, sweet potatoes, turnips, zucchini - Fruits common to the Mediterranean Diet include: apples, apricots, avocados, cherries, clementines, dates, figs, grapefruits, grapes, melons, nectarines, oranges, peaches, pears, pomegranates, strawberries, tangerines  Fats - Replace butter and  margarine with healthy oils, such as olive oil, canola oil, and tahini  - Limit nuts to no more than a handful a day  - Nuts include walnuts, almonds, pecans, pistachios, pine nuts  - Limit or avoid candied, honey roasted or heavily salted nuts - Olives are central to the [employer] - can be eaten whole or used in a variety of dishes   Meats Protein - Limiting red meat: no more than a few times a month - When eating red meat: choose lean cuts and keep the portion to the size of deck of cards - Eggs: approx. 0 to 4 times a week  - Fish and lean poultry: at least 2 a week  - Healthy protein sources include, chicken, [place], lean beef, lamb - Increase intake of seafood such as tuna, salmon, trout, mackerel, shrimp, scallops - Avoid or limit high fat processed meats such as sausage and bacon  Dairy - Include moderate amounts of low fat dairy products  - Focus on healthy dairy such as fat free yogurt, skim milk, low or reduced fat cheese - Limit dairy products higher in fat such as whole or 2% milk, cheese, ice cream  Alcohol - Moderate amounts of red wine is ok  - No more than 5 oz daily for women (all ages) and men older than age 103  - No more than 10 oz of wine daily for men younger than 80  Other - Limit sweets and other desserts  - Use herbs and spices instead of salt to flavor foods  - Herbs and spices common to the traditional Mediterranean Diet include: basil, bay leaves, chives, cloves, cumin, fennel, garlic, lavender, marjoram, mint, oregano, parsley, pepper, rosemary, sage, savory, sumac,  tarragon, thyme   It's not just a diet, it's a lifestyle:  . The Mediterranean diet includes lifestyle factors typical of those in the region  . Foods, drinks and meals are best eaten with others and savored . Daily physical activity is important for overall good health . This could be strenuous exercise like running and aerobics . This could also be more leisurely activities such as  walking, housework, yard-work, or taking the stairs . Moderation is the key; a balanced and healthy diet accommodates most foods and drinks . Consider portion sizes and frequency of consumption of certain foods   Meal Ideas & Options:  . Breakfast:  o Whole wheat toast or whole wheat English muffins with peanut butter & hard boiled egg o Steel cut oats topped with apples & cinnamon and skim milk  o Fresh fruit: banana, strawberries, melon, berries, peaches  o Smoothies: strawberries, bananas, greek yogurt, peanut butter o Low fat greek yogurt with blueberries and granola  o Egg white omelet with spinach and mushrooms o Breakfast couscous: whole wheat couscous, apricots, skim milk, cranberries  . Sandwiches:  o Hummus and grilled vegetables (peppers, zucchini, squash) on whole wheat bread   o Grilled chicken on whole wheat pita with lettuce, tomatoes, cucumbers or tzatziki  o Tuna salad on whole wheat bread: tuna salad made with greek yogurt, olives, red peppers, capers, green onions o Garlic rosemary lamb pita: lamb sauted with garlic, rosemary, salt & pepper; add lettuce, cucumber, greek yogurt to pita - flavor with lemon juice and black pepper  . Seafood:  o Mediterranean grilled salmon, seasoned with garlic, basil, parsley, lemon juice and black pepper o Shrimp, lemon, and spinach whole-grain pasta salad made with low fat greek yogurt  o Seared scallops with lemon orzo  o Seared tuna steaks seasoned salt, pepper, coriander topped with tomato mixture of olives, tomatoes, olive oil, minced garlic, parsley, green onions and cappers  . Meats:  o Herbed greek chicken salad with kalamata olives, cucumber, feta  o Red bell peppers stuffed with spinach, bulgur, lean ground beef (or lentils) & topped with feta   o Kebabs: skewers of chicken, tomatoes, onions, zucchini, squash  o [place] burgers: made with red onions, mint, dill, lemon juice, feta cheese topped with roasted red  peppers . Vegetarian o Cucumber salad: cucumbers, artichoke hearts, celery, red onion, feta cheese, tossed in olive oil & lemon juice  o Hummus and whole grain pita points with a greek salad (lettuce, tomato, feta, olives, cucumbers, red onion) o Lentil soup with celery, carrots made with vegetable broth, garlic, salt and pepper  o Tabouli salad: parsley, bulgur, mint, scallions, cucumbers, tomato, radishes, lemon juice, olive oil, salt and pepper.    Wt Readings from Last 3 Encounters:  07/14/14 142 lb (64.411 kg)  07/08/13 142 lb (64.411 kg)  07/03/12 135 lb (61.236 kg)   Body mass index is 24.36 kg/(m^2).

## 2014-07-19 ENCOUNTER — Other Ambulatory Visit: Payer: Self-pay | Admitting: Internal Medicine

## 2014-07-19 NOTE — Telephone Encounter (Signed)
Sent to the pharmacy by e-scribe. 

## 2014-09-16 ENCOUNTER — Other Ambulatory Visit: Payer: Self-pay | Admitting: Internal Medicine

## 2014-09-16 MED ORDER — ERYTHROMYCIN 5 MG/GM OP OINT: 1.0000 "application " | TOPICAL_OINTMENT | Freq: Four times a day (QID) | OPHTHALMIC | Status: DC

## 2015-05-11 MED FILL — SYNTHROID 25 MCG TABLET: 25 | 30 days supply | Qty: 45 | Fill #10

## 2015-06-15 DIAGNOSIS — H2513 Age-related nuclear cataract, bilateral: Secondary | ICD-10-CM | POA: Diagnosis not present

## 2015-06-15 DIAGNOSIS — H52223 Regular astigmatism, bilateral: Secondary | ICD-10-CM | POA: Diagnosis not present

## 2015-06-15 DIAGNOSIS — H5213 Myopia, bilateral: Secondary | ICD-10-CM | POA: Diagnosis not present

## 2015-06-15 MED FILL — SYNTHROID 25 MCG TABLET: 25 | 30 days supply | Qty: 45 | Fill #11

## 2015-07-13 ENCOUNTER — Other Ambulatory Visit (INDEPENDENT_AMBULATORY_CARE_PROVIDER_SITE_OTHER): Payer: 59

## 2015-07-13 DIAGNOSIS — Z0000 Encounter for general adult medical examination without abnormal findings: Secondary | ICD-10-CM | POA: Diagnosis not present

## 2015-07-13 LAB — BASIC METABOLIC PANEL
BUN: 13 mg/dL (ref 6–23)
CHLORIDE: 101 meq/L (ref 96–112)
CO2: 30 meq/L (ref 19–32)
Calcium: 9.7 mg/dL (ref 8.4–10.5)
Creatinine, Ser: 0.76 mg/dL (ref 0.40–1.50)
GFR: 129.9 mL/min (ref >60.00)
GLUCOSE: 87 mg/dL (ref 70–99)
POTASSIUM: 4.4 meq/L (ref 3.5–5.1)
Sodium: 136 mEq/L (ref 135–145)

## 2015-07-13 LAB — TSH: TSH: 1.5 u[IU]/mL (ref 0.35–4.50)

## 2015-07-13 LAB — CBC WITH DIFFERENTIAL/PLATELET
BASOS PCT: 1 % (ref 0.0–3.0)
Basophils Absolute: 0 10*3/uL (ref 0.0–0.1)
EOS PCT: 0.9 % (ref 0.0–5.0)
Eosinophils Absolute: 0 10*3/uL (ref 0.0–0.7)
HEMATOCRIT: 43.7 % (ref 39.0–52.0)
HEMOGLOBIN: 15.1 g/dL (ref 13.0–17.0)
LYMPHS ABS: 1.6 10*3/uL (ref 0.7–4.0)
Lymphocytes Relative: 36.2 % (ref 12.0–46.0)
MCHC: 34.5 g/dL (ref 30.0–36.0)
MCV: 100.2 fl — ABNORMAL HIGH (ref 78.0–100.0)
MONO ABS: 0.6 10*3/uL (ref 0.1–1.0)
MONOS PCT: 12.4 % — AB (ref 3.0–12.0)
Neutro Abs: 2.2 10*3/uL (ref 1.4–7.7)
Neutrophils Relative %: 49.5 % (ref 43.0–77.0)
Platelets: 224 10*3/uL (ref 150.0–400.0)
RBC: 4.36 Mil/uL (ref 4.22–5.81)
RDW: 13.8 % (ref 11.5–15.5)
WBC: 4.5 10*3/uL (ref 4.0–10.5)

## 2015-07-13 LAB — HEPATIC FUNCTION PANEL
ALT: 18 U/L (ref 0–53)
AST: 17 U/L (ref 0–37)
Albumin: 4.1 g/dL (ref 3.5–5.2)
Alkaline Phosphatase: 77 U/L (ref 39–117)
Bilirubin, Direct: 0.2 mg/dL (ref 0.0–0.3)
TOTAL PROTEIN: 6.9 g/dL (ref 6.0–8.3)
Total Bilirubin: 1.4 mg/dL — ABNORMAL HIGH (ref 0.2–1.2)

## 2015-07-13 LAB — LIPID PANEL
CHOLESTEROL: 212 mg/dL — AB (ref 0–200)
HDL: 35.1 mg/dL — ABNORMAL LOW (ref >39.00)
LDL CALC: 142 mg/dL — AB (ref 0–99)
NONHDL: 176.57
Total CHOL/HDL Ratio: 6
Triglycerides: 173 mg/dL — ABNORMAL HIGH (ref 0.0–149.0)
VLDL: 34.6 mg/dL (ref 0.0–40.0)

## 2015-07-18 ENCOUNTER — Other Ambulatory Visit: Payer: Self-pay | Admitting: Internal Medicine

## 2015-07-19 MED FILL — SYNTHROID 25 MCG TABLET: 25 | 30 days supply | Qty: 45 | Fill #0

## 2015-07-19 NOTE — Progress Notes (Signed)
Chief Complaint  Patient presents with  . Annual Exam  . Hypothyroidism    HPI: Patient  Roy Carr  30 y.o. comes in today for Preventive Health Care visit  Here  ith mom  He has trisomy 3 and  hypothyroid and is doing well  . No change in health except ? If neck is bigger  Sleep good no osa sees dentist  Still working  ? If thyroid casing lipids to be less favorable   Health Maintenance  Topic Date Due  . HIV Screening  08/20/2002  . INFLUENZA VACCINE  11/14/2015  . TETANUS/TDAP  03/03/2017   Health Maintenance Review LIFESTYLE:  Exercise:  y swims walks active some sports  Special olympics  Tobacco/ETS:n Alcohol: n Sugar beverages: water  And oj in am  May be weekly sweet tea  Som vegge straws  Sleep: 10 hours  Drug use: no   ROS:  GEN/ HEENT: No fever, significant weight changes sweats headaches vision problems glasses  hearing changes, CV/ PULM; No chest pain shortness of breath cough, syncope,edema  change in exercise tolerance. GI /GU: No adominal pain, vomiting, change in bowel habits. No blood in the stool. No significant GU symptoms.that momo is aware SKIN/HEME: ,no acute skin rashes suspicious lesions or bleeding. No lymphadenopathy, nodules, masses.  NEURO/ PSYCH:  No neurologic signs such as weakness numbness. No depression anxiety. IMM/ Allergy: No unusual infections.  Allergy .   REST of 12 system review negative except as per HPI   Past Medical History  Diagnosis Date  . Trisomy 21   . Spondylisthesis     grade 2 corrected  . Hypothyroid     prev per Dr Tobe Sos   . Varicose veins     intervention 5 2011  . Personal history of spine surgery     Past Surgical History  Procedure Laterality Date  . Spine surgery    . Wisdom tooth extraction    . Ablation saphenous vein w/ rfa      Family History  Problem Relation Age of Onset  . Melanoma Father     Social History   Social History  . Marital Status: Single    Spouse Name: N/A  .  Number of Children: N/A  . Years of Education: N/A   Social History Main Topics  . Smoking status: Never Smoker   . Smokeless tobacco: None  . Alcohol Use: No  . Drug Use: None  . Sexual Activity: Not Asked   Other Topics Concern  . 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 erythromycin ophthalmic ointment Place 1 application into the left eye 4 (four) times daily. 3.5 g 0  . SYNTHROID 25 MCG tablet TAKE 1 & 1/2 TABLETS BY MOUTH ONCE DAILY 45 tablet 0  . valACYclovir (VALTREX) 1000 MG tablet TAKE 2 TABLETS BY MOUTH TWICE DAILY FOR COLD SORES 30 tablet prn   No facility-administered medications prior to visit.     EXAM:  BP 102/70 mmHg  Pulse 98  Temp(Src) 98.4 F (36.9 C) (Oral)  Ht 5' 5.5" (1.664 m)  Wt 142 lb (64.411 kg)  BMI 23.26 kg/m2  Body mass index is 23.26 kg/(m^2).  Physical Exam: Vital signs reviewed [id number] is a well-developed well-nourished alert cooperative    who appearsr stated age in no acute distress.  Cooperative  Down ss  HEENT: normocephalic atraumatic , Eyes: PERRL EOM's full, conjunctiva clear, Nares: paten,t no deformity discharge or tenderness., Ears: no deformity EAC's clear TMs with normal landmarks. Mouth: clear OP, no lesions, edema narrow palate membranes. Dentition in adequate repair. NECK: supple without r bruits. ? Palpable thryoid  Uncertain exam  Mom feels different  CHEST/PULM:  Clear to auscultation and percussion breath sounds equal no wheeze , rales or rhonchi. No chest wall tenderness. CV: PMI is nondisplaced, S1 S2 no gallops, murmurs, rubs. Peripheral pulses are full without delay.No JVD .    ABDOMEN: Bowel sounds normal nontender  No guard or rebound, no hepato splenomegal no CVA tenderness.   Extremtities:  No clubbing cyanosis or edema, no acute joint swelling or redness no focal atrophy owns  NEURO:  Oriented x3, cranial nerves 3-12 appear to be intact, no obvious focal weakness,gait within normal lfalt non antalgic  SKIN: No acute rashes normal turgor, color, no bruising or petechiae. Some dryness to skin  LN: no cervical axillary inguinal adenopathy  Lab Results  Component Value Date   WBC 4.5 07/13/2015   HGB 15.1 07/13/2015   HCT 43.7 07/13/2015   PLT 224.0 07/13/2015   GLUCOSE 87 07/13/2015   CHOL 212* 07/13/2015   TRIG 173.0* 07/13/2015   HDL 35.10* 07/13/2015   LDLDIRECT 160.4 07/03/2012   LDLCALC 142* 07/13/2015   ALT 18 07/13/2015   AST 17 07/13/2015   NA 136 07/13/2015   K 4.4 07/13/2015   CL 101 07/13/2015   CREATININE 0.76 07/13/2015   BUN 13 07/13/2015   CO2 30 07/13/2015   TSH 1.50 07/13/2015   Wt Readings from Last 3 Encounters:  07/20/15 142 lb (64.411 kg)  07/14/14 142 lb (64.411 kg)  07/08/13 142 lb (64.411 kg)   BP Readings from Last 3 Encounters:  07/20/15 102/70  07/14/14 100/70  07/08/13 92/60   ASSESSMENT AND PLAN:  Discussed the following assessment and plan:  Visit for preventive health examination  Trisomy 21 - Plan: US Soft Tissue Head/Neck  Low HDL (under 40)  Hypothyroidism, unspecified hypothyroidism type - in range  - Plan: US Soft Tissue Head/Neck  Goiter ? - get [place] - Plan: US Soft Tissue Head/Neck  HLD (hyperlipidemia) - counsled consider nutrition referral dont think adj synthroid will make a difference     Elevated MCV - minimal follow 100.3 ? If goiter  Will do [place]  Disc  diet and thyroid   Strategies  discussed  Refill med when out  Same dose  Check b12 level at next visit next year  mcv ULN Patient Care Team: Burnis Medin, MD as PCP - General Calvert Cantor, MD as Attending Physician  (Ophthalmology) Danella Sensing, MD as Consulting Physician (Dermatology) Patient Instructions   Continue lifestyle intervention healthy eating and exercise . consider getting  Pedometer or fit bit to monitor.  mediterranean diet    Is the healthiest and avoiding beverages with sugar and  ( maximum one per day  And OJ counts as this)   Chips breads bagesl biscuits etc .   vege fats better than animal fats  So av[place]ado better than processed meats.  If you wish we can do a nutrition referral also  Lab in a year and  Will add a b12 level also        Health Maintenance, Male A healthy lifestyle and preventative care can promote health and wellness.  Maintain regular health, dental, and eye exams.  Eat a healthy diet. Foods like vegetables, fruits, whole grains, low-fat dairy products, and lean protein foods contain the nutrients you need and are low in calories. Decrease your intake of foods high in solid fats, added sugars, and salt. Get information about a proper diet from your health care provider, if necessary.  Regular physical exercise is one of the most important things you can do for your health. Most adults should get at least 150 minutes of moderate-intensity exercise (any activity that increases your heart rate and causes you to sweat) each week. In addition, most adults need muscle-strengthening exercises on 2 or more days a week.   Maintain a healthy weight. The body mass index (BMI) is a screening tool to identify possible weight problems. It provides an estimate of body fat based on height and weight. Your health care provider can find your BMI and can help you achieve or maintain a healthy weight. For males 20 years and older:  A BMI below 18.5 is considered underweight.  A BMI of 18.5 to 24.9 is normal.  A BMI of 25 to 29.9 is considered overweight.  A BMI of 30 and above is considered obese.  Maintain normal blood lipids and cholesterol by exercising and minimizing  your intake of saturated fat. Eat a balanced diet with plenty of fruits and vegetables. Blood tests for lipids and cholesterol should begin at age 60 and be repeated every 5 years. If your lipid or cholesterol levels are high, you are over age 28, or you are at high risk for heart disease, you may need your cholesterol levels checked more frequently.Ongoing high lipid and cholesterol levels should be treated with medicines if diet and exercise are not working.  If you smoke, find out from your health care provider how to quit. If you do not use tobacco, do not start.  Lung cancer screening is recommended for adults aged 74-80 years who are at high risk for developing lung cancer because of a history of smoking. A yearly low-dose CT scan of the lungs is recommended for people who have at least a 30-pack-year history of smoking and are current smokers or have quit within the past 15 years. A pack year of smoking is smoking an average of 1 pack of cigarettes a day for 1 year (for example, a 30-pack-year history of smoking could mean smoking 1 pack a day for 30 years or 2 packs a day for 15 years). Yearly screening should continue until the smoker has stopped smoking for at least 15 years. Yearly screening should be stopped for people who develop a health problem that would prevent them from having lung cancer treatment.  If you choose to drink alcohol, do not have more than 2 drinks per day. One drink is considered to be 12 oz (360 mL) of beer, 5 oz (150 mL) of wine, or 1.5 oz (45 mL) of liquor.  Avoid the use of street drugs. Do not share needles with anyone. Ask for help if you need support or instructions about stopping the use of drugs.  High blood pressure causes heart disease and increases the risk of stroke. High blood pressure is more likely to develop in:  People who have blood pressure in the end of the normal range (100-139/85-89 mm Hg).  People who are overweight or obese.  People who are  African American.  If you are 43-64 years of age, have your blood pressure checked every 3-5 years. If you are 65 years of age or older, have your blood pressure checked every year. You should have your blood pressure measured twice--once when you are at a hospital or clinic, and once when you are not at a hospital or clinic. Record the average of the two measurements. To check your blood pressure when you are not at a hospital or clinic, you can use:  An automated blood pressure machine at a pharmacy.  A home blood pressure monitor.  If you are 7-80 years old, ask your health care provider if you should take aspirin to prevent heart disease.  Diabetes screening involves taking a blood sample to check your fasting blood sugar level. This should be done once every 3 years after age 38 if you are at a normal weight and without risk factors for diabetes. Testing should be considered at a younger age or be carried out more frequently if you are overweight and have at least 1 risk factor for diabetes.  Colorectal cancer can be detected and often prevented. Most routine colorectal cancer screening begins at the age of 32 and continues through age 42. However, your health care provider may recommend screening at an earlier age if you have risk factors for colon cancer. On a yearly basis, your health care provider may provide home test kits to check for hidden blood in the stool. A small camera at the end of a tube may be used to directly examine the colon (sigmoidoscopy or colonoscopy) to detect the earliest forms of colorectal cancer. Talk to your health care provider about this at age 69 when routine screening begins. A direct exam of the colon should be repeated every 5-10 years through age 38, unless early forms of precancerous polyps or small growths are found.  People who are at an increased risk for hepatitis B should be screened for this virus. You are considered at high risk for hepatitis B  if:  You were born in a country where hepatitis B occurs often. Talk with your health care provider about which countries are considered high risk.  Your parents were born in a high-risk country and you have not received a shot to protect against hepatitis B (hepatitis B vaccine).  You have HIV or AIDS.  You use needles to inject street drugs.  You live with, or have sex with, someone who has hepatitis B.  You are a man who has sex with other men (MSM).  You get hemodialysis treatment.  You take certain medicines for conditions like cancer, organ transplantation, and autoimmune conditions.  Hepatitis C blood testing is recommended for all people born from [birth year] through 1963 and any individual with known risk factors for hepatitis C.  Healthy men should no longer receive prostate-specific antigen (PSA) blood tests as part of routine cancer screening. Talk to your health care provider about prostate cancer screening.  Testicular cancer screening is not recommended for adolescents or adult males who have no symptoms. Screening includes self-exam, a health care provider exam, and other screening tests. Consult with your health care provider about any symptoms you have or any concerns you have about testicular cancer.  Practice safe sex. Use condoms and avoid high-risk sexual practices to reduce the spread of  sexually transmitted infections (STIs).  You should be screened for STIs, including gonorrhea and chlamydia if:  You are sexually active and are younger than 24 years.  You are older than 24 years, and your health care provider tells you that you are at risk for this type of infection.  Your sexual activity has changed since you were last screened, and you are at an increased risk for chlamydia or gonorrhea. Ask your health care provider if you are at risk.  If you are at risk of being infected with HIV, it is recommended that you take a prescription medicine daily to prevent HIV  infection. This is called pre-exposure prophylaxis (PrEP). You are considered at risk if:  You are a man who has sex with other men (MSM).  You are a heterosexual man who is sexually active with multiple partners.  You take drugs by injection.  You are sexually active with a partner who has HIV.  Talk with your health care provider about whether you are at high risk of being infected with HIV. If you choose to begin PrEP, you should first be tested for HIV. You should then be tested every 3 months for as long as you are taking PrEP.  Use sunscreen. Apply sunscreen liberally and repeatedly throughout the day. You should seek shade when your shadow is shorter than you. Protect yourself by wearing long sleeves, pants, a wide-brimmed hat, and sunglasses year round whenever you are outdoors.  Tell your health care provider of new moles or changes in moles, especially if there is a change in shape or color. Also, tell your health care provider if a mole is larger than the size of a pencil eraser.  A one-time screening for abdominal aortic aneurysm (AAA) and surgical repair of large AAAs by ultrasound is recommended for men aged 74-75 years who are current or former smokers.  Stay current with your vaccines (immunizations).   This information is not intended to replace advice given to you by your health care provider. Make sure you discuss any questions you have with your health care provider.   Document Released: 09/28/2007 Document Revised: 04/22/2014 Document Reviewed: 08/27/2010 Elsevier Interactive Patient Education [employer].       Why follow it? Research shows. . Those who follow the Mediterranean diet have a reduced risk of heart disease  . The diet is associated with a reduced incidence of Parkinson's and Alzheimer's diseases . People following the diet may have longer life expectancies and lower rates of chronic diseases  . The Dietary Guidelines for Americans recommends  the Mediterranean diet as an eating plan to promote health and prevent disease  What Is the Mediterranean Diet?  . Healthy eating plan based on typical foods and recipes of Mediterranean-style cooking . The diet is primarily a plant based diet; these foods should make up a majority of meals   Starches - Plant based foods should make up a majority of meals - They are an important sources of vitamins, minerals, energy, antioxidants, and fiber - Choose whole grains, foods high in fiber and minimally processed items  - Typical grain sources include wheat, oats, barley, corn, brown rice, bulgar, farro, millet, polenta, couscous  - Various types of beans include chickpeas, lentils, fava beans, black beans, white beans   Fruits  Veggies - Large quantities of antioxidant rich fruits & veggies; 6 or more servings  - Vegetables can be eaten raw or lightly drizzled with oil and cooked  - Vegetables  common to the traditional Mediterranean Diet include: artichokes, arugula, beets, broccoli, brussel sprouts, cabbage, carrots, celery, collard greens, cucumbers, eggplant, kale, leeks, lemons, lettuce, mushrooms, okra, onions, peas, peppers, potatoes, pumpkin, radishes, rutabaga, shallots, spinach, sweet potatoes, turnips, zucchini - Fruits common to the Mediterranean Diet include: apples, apricots, avocados, cherries, clementines, dates, figs, grapefruits, grapes, melons, nectarines, oranges, peaches, pears, pomegranates, strawberries, tangerines  Fats - Replace butter and margarine with healthy oils, such as olive oil, canola oil, and tahini  - Limit nuts to no more than a handful a day  - Nuts include walnuts, almonds, pecans, pistachios, pine nuts  - Limit or avoid candied, honey roasted or heavily salted nuts - Olives are central to the [employer] - can be eaten whole or used in a variety of dishes   Meats Protein - Limiting red meat: no more than a few times a month - When eating red meat: choose  lean cuts and keep the portion to the size of deck of cards - Eggs: approx. 0 to 4 times a week  - Fish and lean poultry: at least 2 a week  - Healthy protein sources include, chicken, [place], lean beef, lamb - Increase intake of seafood such as tuna, salmon, trout, mackerel, shrimp, scallops - Avoid or limit high fat processed meats such as sausage and bacon  Dairy - Include moderate amounts of low fat dairy products  - Focus on healthy dairy such as fat free yogurt, skim milk, low or reduced fat cheese - Limit dairy products higher in fat such as whole or 2% milk, cheese, ice cream  Alcohol - Moderate amounts of red wine is ok  - No more than 5 oz daily for women (all ages) and men older than age 5  - No more than 10 oz of wine daily for men younger than 36  Other - Limit sweets and other desserts  - Use herbs and spices instead of salt to flavor foods  - Herbs and spices common to the traditional Mediterranean Diet include: basil, bay leaves, chives, cloves, cumin, fennel, garlic, lavender, marjoram, mint, oregano, parsley, pepper, rosemary, sage, savory, sumac, tarragon, thyme   It's not just a diet, it's a lifestyle:  . The Mediterranean diet includes lifestyle factors typical of those in the region  . Foods, drinks and meals are best eaten with others and savored . Daily physical activity is important for overall good health . This could be strenuous exercise like running and aerobics . This could also be more leisurely activities such as walking, housework, yard-work, or taking the stairs . Moderation is the key; a balanced and healthy diet accommodates most foods and drinks . Consider portion sizes and frequency of consumption of certain foods   Meal Ideas & Options:  . Breakfast:  o Whole wheat toast or whole wheat English muffins with peanut butter & hard boiled egg o Steel cut oats topped with apples & cinnamon and skim milk  o Fresh fruit: banana, strawberries, melon,  berries, peaches  o Smoothies: strawberries, bananas, greek yogurt, peanut butter o Low fat greek yogurt with blueberries and granola  o Egg white omelet with spinach and mushrooms o Breakfast couscous: whole wheat couscous, apricots, skim milk, cranberries  . Sandwiches:  o Hummus and grilled vegetables (peppers, zucchini, squash) on whole wheat bread   o Grilled chicken on whole wheat pita with lettuce, tomatoes, cucumbers or tzatziki  o Tuna salad on whole wheat bread: tuna salad made with greek yogurt, olives,  red peppers, capers, green onions o Garlic rosemary lamb pita: lamb sauted with garlic, rosemary, salt & pepper; add lettuce, cucumber, greek yogurt to pita - flavor with lemon juice and black pepper  . Seafood:  o Mediterranean grilled salmon, seasoned with garlic, basil, parsley, lemon juice and black pepper o Shrimp, lemon, and spinach whole-grain pasta salad made with low fat greek yogurt  o Seared scallops with lemon orzo  o Seared tuna steaks seasoned salt, pepper, coriander topped with tomato mixture of olives, tomatoes, olive oil, minced garlic, parsley, green onions and cappers  . Meats:  o Herbed greek chicken salad with kalamata olives, cucumber, feta  o Red bell peppers stuffed with spinach, bulgur, lean ground beef (or lentils) & topped with feta   o Kebabs: skewers of chicken, tomatoes, onions, zucchini, squash  o [place] burgers: made with red onions, mint, dill, lemon juice, feta cheese topped with roasted red peppers . Vegetarian o Cucumber salad: cucumbers, artichoke hearts, celery, red onion, feta cheese, tossed in olive oil & lemon juice  o Hummus and whole grain pita points with a greek salad (lettuce, tomato, feta, olives, cucumbers, red onion) o Lentil soup with celery, carrots made with vegetable broth, garlic, salt and pepper  o Tabouli salad: parsley, bulgur, mint, scallions, cucumbers, tomato, radishes, lemon juice, olive oil, salt and  pepper.           Standley Brooking. Clytee Heinrich M.D.

## 2015-07-19 NOTE — Telephone Encounter (Signed)
Sent to the pharmacy by e-scribe. 

## 2015-07-20 ENCOUNTER — Encounter: Payer: Self-pay | Admitting: Internal Medicine

## 2015-07-20 ENCOUNTER — Ambulatory Visit (INDEPENDENT_AMBULATORY_CARE_PROVIDER_SITE_OTHER): Payer: 59 | Admitting: Internal Medicine

## 2015-07-20 VITALS — BP 102/70 | HR 98 | Temp 98.4°F | Ht 65.5 in | Wt 142.0 lb

## 2015-07-20 DIAGNOSIS — Q909 Down syndrome, unspecified: Secondary | ICD-10-CM

## 2015-07-20 DIAGNOSIS — Z0000 Encounter for general adult medical examination without abnormal findings: Secondary | ICD-10-CM

## 2015-07-20 DIAGNOSIS — E786 Lipoprotein deficiency: Secondary | ICD-10-CM

## 2015-07-20 DIAGNOSIS — E049 Nontoxic goiter, unspecified: Secondary | ICD-10-CM | POA: Diagnosis not present

## 2015-07-20 DIAGNOSIS — E039 Hypothyroidism, unspecified: Secondary | ICD-10-CM | POA: Diagnosis not present

## 2015-07-20 DIAGNOSIS — R718 Other abnormality of red blood cells: Secondary | ICD-10-CM

## 2015-07-20 DIAGNOSIS — E785 Hyperlipidemia, unspecified: Secondary | ICD-10-CM

## 2015-07-20 NOTE — Progress Notes (Signed)
Pre visit review using our clinic review tool, if applicable. No additional management support is needed unless otherwise documented below in the visit note. 

## 2015-07-20 NOTE — Patient Instructions (Signed)
Continue lifestyle intervention healthy eating and exercise . consider getting  Pedometer or fit bit to monitor.  mediterranean diet    Is the healthiest and avoiding beverages with sugar and  ( maximum one per day  And OJ counts as this)   Chips breads bagesl biscuits etc .   vege fats better than animal fats  So avacado better than processed meats.  If you wish we can do a nutrition referral also  Lab in a year and  Will add a b12 level also        Health Maintenance, Male A healthy lifestyle and preventative care can promote health and wellness.  Maintain regular health, dental, and eye exams.  Eat a healthy diet. Foods like vegetables, fruits, whole grains, low-fat dairy products, and lean protein foods contain the nutrients you need and are low in calories. Decrease your intake of foods high in solid fats, added sugars, and salt. Get information about a proper diet from your health care provider, if necessary.  Regular physical exercise is one of the most important things you can do for your health. Most adults should get at least 150 minutes of moderate-intensity exercise (any activity that increases your heart rate and causes you to sweat) each week. In addition, most adults need muscle-strengthening exercises on 2 or more days a week.   Maintain a healthy weight. The body mass index (BMI) is a screening tool to identify possible weight problems. It provides an estimate of body fat based on height and weight. Your health care provider can find your BMI and can help you achieve or maintain a healthy weight. For males 20 years and older:  A BMI below 18.5 is considered underweight.  A BMI of 18.5 to 24.9 is normal.  A BMI of 25 to 29.9 is considered overweight.  A BMI of 30 and above is considered obese.  Maintain normal blood lipids and cholesterol by exercising and minimizing your intake of saturated fat. Eat a balanced diet with plenty of fruits and vegetables. Blood tests  for lipids and cholesterol should begin at age 54 and be repeated every 5 years. If your lipid or cholesterol levels are high, you are over age 68, or you are at high risk for heart disease, you may need your cholesterol levels checked more frequently.Ongoing high lipid and cholesterol levels should be treated with medicines if diet and exercise are not working.  If you smoke, find out from your health care provider how to quit. If you do not use tobacco, do not start.  Lung cancer screening is recommended for adults aged 60-80 years who are at high risk for developing lung cancer because of a history of smoking. A yearly low-dose CT scan of the lungs is recommended for people who have at least a 30-pack-year history of smoking and are current smokers or have quit within the past 15 years. A pack year of smoking is smoking an average of 1 pack of cigarettes a day for 1 year (for example, a 30-pack-year history of smoking could mean smoking 1 pack a day for 30 years or 2 packs a day for 15 years). Yearly screening should continue until the smoker has stopped smoking for at least 15 years. Yearly screening should be stopped for people who develop a health problem that would prevent them from having lung cancer treatment.  If you choose to drink alcohol, do not have more than 2 drinks per day. One drink is considered to be 12  oz (360 mL) of beer, 5 oz (150 mL) of wine, or 1.5 oz (45 mL) of liquor.  Avoid the use of street drugs. Do not share needles with anyone. Ask for help if you need support or instructions about stopping the use of drugs.  High blood pressure causes heart disease and increases the risk of stroke. High blood pressure is more likely to develop in:  People who have blood pressure in the end of the normal range (100-139/85-89 mm Hg).  People who are overweight or obese.  People who are African American.  If you are 29-52 years of age, have your blood pressure checked every 3-5 years.  If you are 26 years of age or older, have your blood pressure checked every year. You should have your blood pressure measured twice--once when you are at a hospital or clinic, and once when you are not at a hospital or clinic. Record the average of the two measurements. To check your blood pressure when you are not at a hospital or clinic, you can use:  An automated blood pressure machine at a pharmacy.  A home blood pressure monitor.  If you are 82-72 years old, ask your health care provider if you should take aspirin to prevent heart disease.  Diabetes screening involves taking a blood sample to check your fasting blood sugar level. This should be done once every 3 years after age 20 if you are at a normal weight and without risk factors for diabetes. Testing should be considered at a younger age or be carried out more frequently if you are overweight and have at least 1 risk factor for diabetes.  Colorectal cancer can be detected and often prevented. Most routine colorectal cancer screening begins at the age of 64 and continues through age 58. However, your health care provider may recommend screening at an earlier age if you have risk factors for colon cancer. On a yearly basis, your health care provider may provide home test kits to check for hidden blood in the stool. A small camera at the end of a tube may be used to directly examine the colon (sigmoidoscopy or colonoscopy) to detect the earliest forms of colorectal cancer. Talk to your health care provider about this at age 46 when routine screening begins. A direct exam of the colon should be repeated every 5-10 years through age 26, unless early forms of precancerous polyps or small growths are found.  People who are at an increased risk for hepatitis B should be screened for this virus. You are considered at high risk for hepatitis B if:  You were born in a country where hepatitis B occurs often. Talk with your health care provider about  which countries are considered high risk.  Your parents were born in a high-risk country and you have not received a shot to protect against hepatitis B (hepatitis B vaccine).  You have HIV or AIDS.  You use needles to inject street drugs.  You live with, or have sex with, someone who has hepatitis B.  You are a man who has sex with other men (MSM).  You get hemodialysis treatment.  You take certain medicines for conditions like cancer, organ transplantation, and autoimmune conditions.  Hepatitis C blood testing is recommended for all people born from [birth year] through 1963 and any individual with known risk factors for hepatitis C.  Healthy men should no longer receive prostate-specific antigen (PSA) blood tests as part of routine cancer screening. Talk to your health  care provider about prostate cancer screening.  Testicular cancer screening is not recommended for adolescents or adult males who have no symptoms. Screening includes self-exam, a health care provider exam, and other screening tests. Consult with your health care provider about any symptoms you have or any concerns you have about testicular cancer.  Practice safe sex. Use condoms and avoid high-risk sexual practices to reduce the spread of sexually transmitted infections (STIs).  You should be screened for STIs, including gonorrhea and chlamydia if:  You are sexually active and are younger than 24 years.  You are older than 24 years, and your health care provider tells you that you are at risk for this type of infection.  Your sexual activity has changed since you were last screened, and you are at an increased risk for chlamydia or gonorrhea. Ask your health care provider if you are at risk.  If you are at risk of being infected with HIV, it is recommended that you take a prescription medicine daily to prevent HIV infection. This is called pre-exposure prophylaxis (PrEP). You are considered at risk if:  You are a man who  has sex with other men (MSM).  You are a heterosexual man who is sexually active with multiple partners.  You take drugs by injection.  You are sexually active with a partner who has HIV.  Talk with your health care provider about whether you are at high risk of being infected with HIV. If you choose to begin PrEP, you should first be tested for HIV. You should then be tested every 3 months for as long as you are taking PrEP.  Use sunscreen. Apply sunscreen liberally and repeatedly throughout the day. You should seek shade when your shadow is shorter than you. Protect yourself by wearing long sleeves, pants, a wide-brimmed hat, and sunglasses year round whenever you are outdoors.  Tell your health care provider of new moles or changes in moles, especially if there is a change in shape or color. Also, tell your health care provider if a mole is larger than the size of a pencil eraser.  A one-time screening for abdominal aortic aneurysm (AAA) and surgical repair of large AAAs by ultrasound is recommended for men aged 51-75 years who are current or former smokers.  Stay current with your vaccines (immunizations).   This information is not intended to replace advice given to you by your health care provider. Make sure you discuss any questions you have with your health care provider.   Document Released: 09/28/2007 Document Revised: 04/22/2014 Document Reviewed: 08/27/2010 Elsevier Interactive Patient Education [employer].       Why follow it? Research shows. . Those who follow the Mediterranean diet have a reduced risk of heart disease  . The diet is associated with a reduced incidence of Parkinson's and Alzheimer's diseases . People following the diet may have longer life expectancies and lower rates of chronic diseases  . The Dietary Guidelines for Americans recommends the Mediterranean diet as an eating plan to promote health and prevent disease  What Is the Mediterranean Diet?   . Healthy eating plan based on typical foods and recipes of Mediterranean-style cooking . The diet is primarily a plant based diet; these foods should make up a majority of meals   Starches - Plant based foods should make up a majority of meals - They are an important sources of vitamins, minerals, energy, antioxidants, and fiber - Choose whole grains, foods high in fiber and minimally  processed items  - Typical grain sources include wheat, oats, barley, corn, brown rice, bulgar, farro, millet, polenta, couscous  - Various types of beans include chickpeas, lentils, fava beans, black beans, white beans   Fruits  Veggies - Large quantities of antioxidant rich fruits & veggies; 6 or more servings  - Vegetables can be eaten raw or lightly drizzled with oil and cooked  - Vegetables common to the traditional Mediterranean Diet include: artichokes, arugula, beets, broccoli, brussel sprouts, cabbage, carrots, celery, collard greens, cucumbers, eggplant, kale, leeks, lemons, lettuce, mushrooms, okra, onions, peas, peppers, potatoes, pumpkin, radishes, rutabaga, shallots, spinach, sweet potatoes, turnips, zucchini - Fruits common to the Mediterranean Diet include: apples, apricots, avocados, cherries, clementines, dates, figs, grapefruits, grapes, melons, nectarines, oranges, peaches, pears, pomegranates, strawberries, tangerines  Fats - Replace butter and margarine with healthy oils, such as olive oil, canola oil, and tahini  - Limit nuts to no more than a handful a day  - Nuts include walnuts, almonds, pecans, pistachios, pine nuts  - Limit or avoid candied, honey roasted or heavily salted nuts - Olives are central to the [employer] - can be eaten whole or used in a variety of dishes   Meats Protein - Limiting red meat: no more than a few times a month - When eating red meat: choose lean cuts and keep the portion to the size of deck of cards - Eggs: approx. 0 to 4 times a week  - Fish and lean  poultry: at least 2 a week  - Healthy protein sources include, chicken, [place], lean beef, lamb - Increase intake of seafood such as tuna, salmon, trout, mackerel, shrimp, scallops - Avoid or limit high fat processed meats such as sausage and bacon  Dairy - Include moderate amounts of low fat dairy products  - Focus on healthy dairy such as fat free yogurt, skim milk, low or reduced fat cheese - Limit dairy products higher in fat such as whole or 2% milk, cheese, ice cream  Alcohol - Moderate amounts of red wine is ok  - No more than 5 oz daily for women (all ages) and men older than age 75  - No more than 10 oz of wine daily for men younger than 63  Other - Limit sweets and other desserts  - Use herbs and spices instead of salt to flavor foods  - Herbs and spices common to the traditional Mediterranean Diet include: basil, bay leaves, chives, cloves, cumin, fennel, garlic, lavender, marjoram, mint, oregano, parsley, pepper, rosemary, sage, savory, sumac, tarragon, thyme   It's not just a diet, it's a lifestyle:  . The Mediterranean diet includes lifestyle factors typical of those in the region  . Foods, drinks and meals are best eaten with others and savored . Daily physical activity is important for overall good health . This could be strenuous exercise like running and aerobics . This could also be more leisurely activities such as walking, housework, yard-work, or taking the stairs . Moderation is the key; a balanced and healthy diet accommodates most foods and drinks . Consider portion sizes and frequency of consumption of certain foods   Meal Ideas & Options:  . Breakfast:  o Whole wheat toast or whole wheat English muffins with peanut butter & hard boiled egg o Steel cut oats topped with apples & cinnamon and skim milk  o Fresh fruit: banana, strawberries, melon, berries, peaches  o Smoothies: strawberries, bananas, greek yogurt, peanut butter o Low fat greek yogurt with  blueberries and granola  o Egg white omelet with spinach and mushrooms o Breakfast couscous: whole wheat couscous, apricots, skim milk, cranberries  . Sandwiches:  o Hummus and grilled vegetables (peppers, zucchini, squash) on whole wheat bread   o Grilled chicken on whole wheat pita with lettuce, tomatoes, cucumbers or tzatziki  o Tuna salad on whole wheat bread: tuna salad made with greek yogurt, olives, red peppers, capers, green onions o Garlic rosemary lamb pita: lamb sauted with garlic, rosemary, salt & pepper; add lettuce, cucumber, greek yogurt to pita - flavor with lemon juice and black pepper  . Seafood:  o Mediterranean grilled salmon, seasoned with garlic, basil, parsley, lemon juice and black pepper o Shrimp, lemon, and spinach whole-grain pasta salad made with low fat greek yogurt  o Seared scallops with lemon orzo  o Seared tuna steaks seasoned salt, pepper, coriander topped with tomato mixture of olives, tomatoes, olive oil, minced garlic, parsley, green onions and cappers  . Meats:  o Herbed greek chicken salad with kalamata olives, cucumber, feta  o Red bell peppers stuffed with spinach, bulgur, lean ground beef (or lentils) & topped with feta   o Kebabs: skewers of chicken, tomatoes, onions, zucchini, squash  o [place] burgers: made with red onions, mint, dill, lemon juice, feta cheese topped with roasted red peppers . Vegetarian o Cucumber salad: cucumbers, artichoke hearts, celery, red onion, feta cheese, tossed in olive oil & lemon juice  o Hummus and whole grain pita points with a greek salad (lettuce, tomato, feta, olives, cucumbers, red onion) o Lentil soup with celery, carrots made with vegetable broth, garlic, salt and pepper  o Tabouli salad: parsley, bulgur, mint, scallions, cucumbers, tomato, radishes, lemon juice, olive oil, salt and pepper.

## 2015-08-16 ENCOUNTER — Other Ambulatory Visit: Payer: Self-pay | Admitting: Internal Medicine

## 2015-08-17 ENCOUNTER — Ambulatory Visit
Admission: RE | Admit: 2015-08-17 | Discharge: 2015-08-17 | Disposition: A | Discharge status: Home / Self Care | Payer: 59 | Source: Ambulatory Visit | Attending: Internal Medicine | Admitting: Internal Medicine

## 2015-08-17 DIAGNOSIS — Q909 Down syndrome, unspecified: Secondary | ICD-10-CM

## 2015-08-17 DIAGNOSIS — E049 Nontoxic goiter, unspecified: Secondary | ICD-10-CM

## 2015-08-17 DIAGNOSIS — E039 Hypothyroidism, unspecified: Secondary | ICD-10-CM

## 2015-08-17 DIAGNOSIS — E079 Disorder of thyroid, unspecified: Secondary | ICD-10-CM | POA: Diagnosis not present

## 2015-08-17 NOTE — Telephone Encounter (Signed)
Pt is out of synthroid

## 2015-08-18 ENCOUNTER — Telehealth: Payer: Self-pay | Admitting: Internal Medicine

## 2015-08-18 MED ORDER — SYNTHROID 25 MCG PO TABS: 37.5000 ug | ORAL_TABLET | Freq: Every day | ORAL | Status: DC

## 2015-08-18 MED FILL — SYNTHROID 25 MCG TABLET: 25 | 30 days supply | Qty: 45 | Fill #0

## 2015-08-18 NOTE — Telephone Encounter (Signed)
Mom call to ask if the med refill can be for 12 refills instead of 3     Pharmacy [facility] [facility]

## 2015-08-18 NOTE — Telephone Encounter (Signed)
Sent to the pharmacy by e-scribe. 

## 2015-09-13 MED FILL — SYNTHROID 25 MCG TABLET: 25 | 30 days supply | Qty: 45 | Fill #1

## 2015-09-26 ENCOUNTER — Other Ambulatory Visit: Payer: Self-pay | Admitting: Internal Medicine

## 2015-09-26 MED FILL — valACYclovir HCL 1 GM TABS: 1 | 8 days supply | Qty: 30 | Fill #0

## 2015-09-26 NOTE — Telephone Encounter (Signed)
Rx refill sent to pharmacy. 

## 2015-10-12 MED FILL — SYNTHROID 25 MCG TABLET: 25 | 30 days supply | Qty: 45 | Fill #2

## 2015-11-13 MED FILL — SYNTHROID 25 MCG TABLET: 25 | 30 days supply | Qty: 45 | Fill #3

## 2015-12-12 MED FILL — SYNTHROID 25 MCG TABLET: 25 | 30 days supply | Qty: 45 | Fill #4

## 2016-01-15 MED FILL — SYNTHROID 25 MCG TABLET: 25 | 30 days supply | Qty: 45 | Fill #5

## 2016-01-31 MED FILL — valACYclovir HCL 1 GM TABS: 1 | 8 days supply | Qty: 30 | Fill #1

## 2016-02-13 MED FILL — SYNTHROID 25 MCG TABLET: 25 | 30 days supply | Qty: 45 | Fill #6

## 2016-03-13 MED FILL — SYNTHROID 25 MCG TABLET: 25 | 30 days supply | Qty: 45 | Fill #7

## 2016-04-11 MED FILL — SYNTHROID 25 MCG TABLET: 25 | 30 days supply | Qty: 45 | Fill #8

## 2016-04-18 ENCOUNTER — Encounter: Payer: Self-pay | Admitting: Internal Medicine

## 2016-04-18 NOTE — Telephone Encounter (Signed)
No instructions available in Epic.  Need directions for use.

## 2016-04-18 NOTE — Telephone Encounter (Signed)
Ok with me  I havne nt used this  dont know cost  Is ist a gel  , solutions ,or other   ?Spray  Ok top send in one bottle .

## 2016-04-19 MED ORDER — ALEVICYN DERMAL SPRAY EX SOLN: CUTANEOUS | 0 refills | Status: DC

## 2016-04-19 NOTE — Telephone Encounter (Signed)
   Apply a small amount of topical emollient to the affected area and rub in gently.  To use as needed.

## 2016-04-23 MED ORDER — ALEVICYN ANTIPRURITIC EX GEL: CUTANEOUS | 0 refills | Status: DC

## 2016-04-23 NOTE — Addendum Note (Signed)
Addended by: Miles Costain T on: 04/23/2016 09:21 AM   Modules accepted: Orders

## 2016-05-16 MED FILL — SYNTHROID 25 MCG TABLET: 25 | 30 days supply | Qty: 45 | Fill #0

## 2016-05-27 MED FILL — ALEVICYN DERMAL SPRAY: 30 days supply | Qty: 240 | Fill #0

## 2016-06-13 MED FILL — SYNTHROID 25 MCG TABLET: 25 | 30 days supply | Qty: 45 | Fill #1

## 2016-06-20 DIAGNOSIS — H52223 Regular astigmatism, bilateral: Secondary | ICD-10-CM | POA: Diagnosis not present

## 2016-06-20 DIAGNOSIS — H04123 Dry eye syndrome of bilateral lacrimal glands: Secondary | ICD-10-CM | POA: Diagnosis not present

## 2016-06-20 DIAGNOSIS — H5213 Myopia, bilateral: Secondary | ICD-10-CM | POA: Diagnosis not present

## 2016-07-15 MED FILL — SYNTHROID 25 MCG TABLET: 25 | 30 days supply | Qty: 45 | Fill #2

## 2016-07-25 ENCOUNTER — Other Ambulatory Visit: Payer: 59

## 2016-07-26 ENCOUNTER — Other Ambulatory Visit (INDEPENDENT_AMBULATORY_CARE_PROVIDER_SITE_OTHER): Payer: 59

## 2016-07-26 DIAGNOSIS — Q909 Down syndrome, unspecified: Secondary | ICD-10-CM | POA: Diagnosis not present

## 2016-07-26 DIAGNOSIS — E039 Hypothyroidism, unspecified: Secondary | ICD-10-CM

## 2016-07-26 DIAGNOSIS — E786 Lipoprotein deficiency: Secondary | ICD-10-CM

## 2016-07-26 LAB — CBC WITH DIFFERENTIAL/PLATELET
BASOS ABS: 0.1 10*3/uL (ref 0.0–0.1)
BASOS PCT: 1.9 % (ref 0.0–3.0)
EOS ABS: 0 10*3/uL (ref 0.0–0.7)
Eosinophils Relative: 0.7 % (ref 0.0–5.0)
HEMATOCRIT: 44.9 % (ref 39.0–52.0)
Hemoglobin: 15.2 g/dL (ref 13.0–17.0)
LYMPHS ABS: 1.3 10*3/uL (ref 0.7–4.0)
LYMPHS PCT: 25.8 % (ref 12.0–46.0)
MCHC: 33.9 g/dL (ref 30.0–36.0)
MCV: 102.1 fl — ABNORMAL HIGH (ref 78.0–100.0)
MONO ABS: 0.6 10*3/uL (ref 0.1–1.0)
Monocytes Relative: 11.3 % (ref 3.0–12.0)
NEUTROS ABS: 3.1 10*3/uL (ref 1.4–7.7)
NEUTROS PCT: 60.3 % (ref 43.0–77.0)
PLATELETS: 225 10*3/uL (ref 150.0–400.0)
RBC: 4.4 Mil/uL (ref 4.22–5.81)
RDW: 13.6 % (ref 11.5–15.5)
WBC: 5.2 10*3/uL (ref 4.0–10.5)

## 2016-07-26 LAB — LIPID PANEL
CHOL/HDL RATIO: 6
CHOLESTEROL: 204 mg/dL — AB (ref 0–200)
HDL: 35.2 mg/dL — ABNORMAL LOW (ref >39.00)
LDL CALC: 141 mg/dL — AB (ref 0–99)
NonHDL: 169.27
TRIGLYCERIDES: 141 mg/dL (ref 0.0–149.0)
VLDL: 28.2 mg/dL (ref 0.0–40.0)

## 2016-07-26 LAB — BASIC METABOLIC PANEL
BUN: 12 mg/dL (ref 6–23)
CALCIUM: 9.3 mg/dL (ref 8.4–10.5)
CHLORIDE: 105 meq/L (ref 96–112)
CO2: 30 meq/L (ref 19–32)
CREATININE: 0.78 mg/dL (ref 0.40–1.50)
GFR: 125.13 mL/min (ref >60.00)
GLUCOSE: 85 mg/dL (ref 70–99)
Potassium: 4.2 mEq/L (ref 3.5–5.1)
Sodium: 140 mEq/L (ref 135–145)

## 2016-07-26 LAB — TSH: TSH: 3.1 u[IU]/mL (ref 0.35–4.50)

## 2016-07-26 LAB — HEPATIC FUNCTION PANEL
ALT: 16 U/L (ref 0–53)
AST: 14 U/L (ref 0–37)
Albumin: 4 g/dL (ref 3.5–5.2)
Alkaline Phosphatase: 76 U/L (ref 39–117)
BILIRUBIN TOTAL: 1 mg/dL (ref 0.2–1.2)
Bilirubin, Direct: 0.2 mg/dL (ref 0.0–0.3)
TOTAL PROTEIN: 6.9 g/dL (ref 6.0–8.3)

## 2016-07-29 IMAGING — US US SOFT TISSUE HEAD/NECK
1 series · 14 of 25 positions shown · non-contrast
Comparison: None.

CLINICAL DATA: Neck enlargement

EXAM:
THYROID ULTRASOUND
TECHNIQUE: Ultrasound examination of the thyroid gland and adjacent soft
tissues was performed.

[Series 1: us soft tissue head/neck · 0.04mm/px · 14 of 42 slices shown]
[im 1/42]
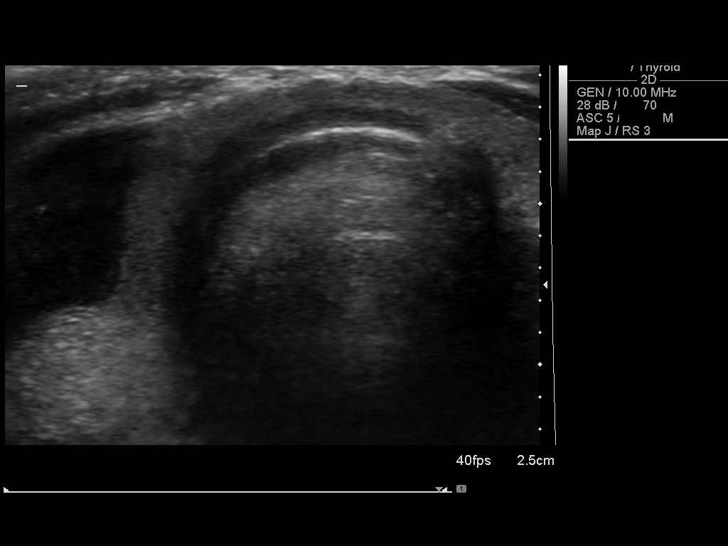
[im 4/42]
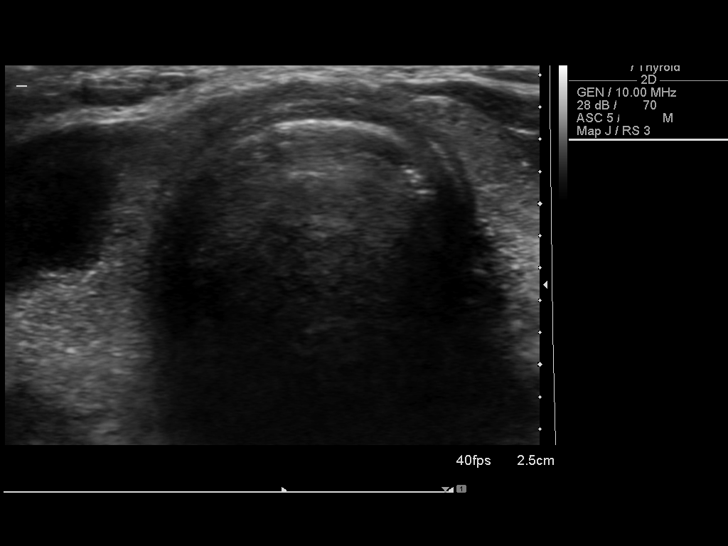
[im 7/42]
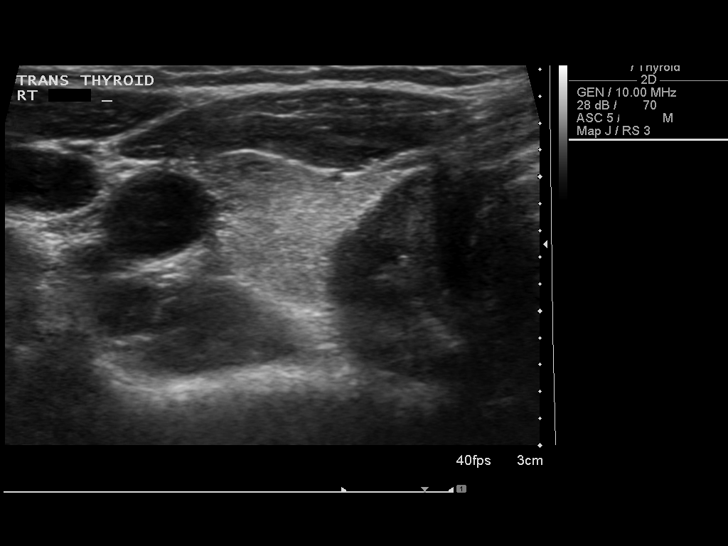
[im 11/42]
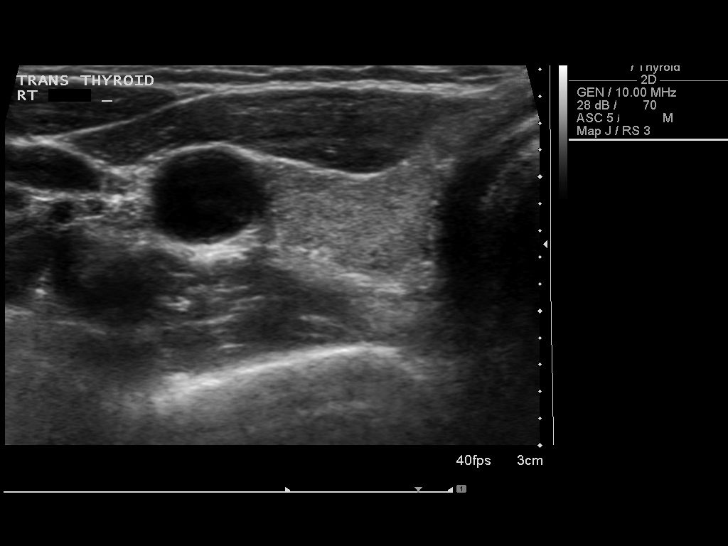
[im 14/42]
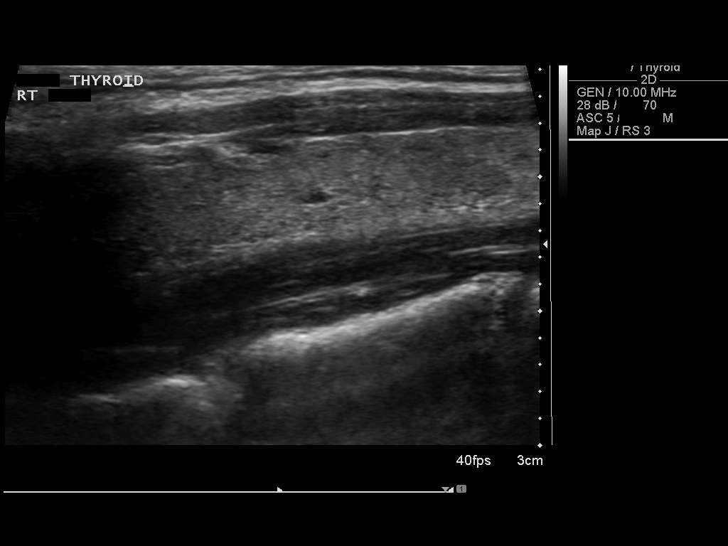
[im 16/42]
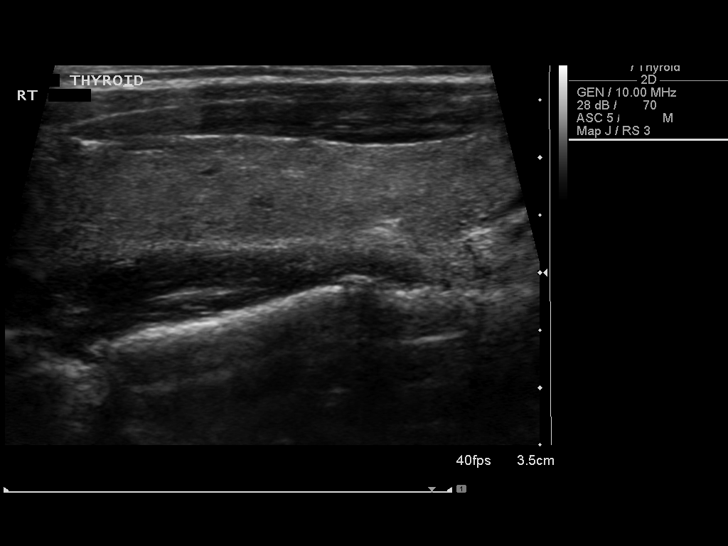
[im 19/42]
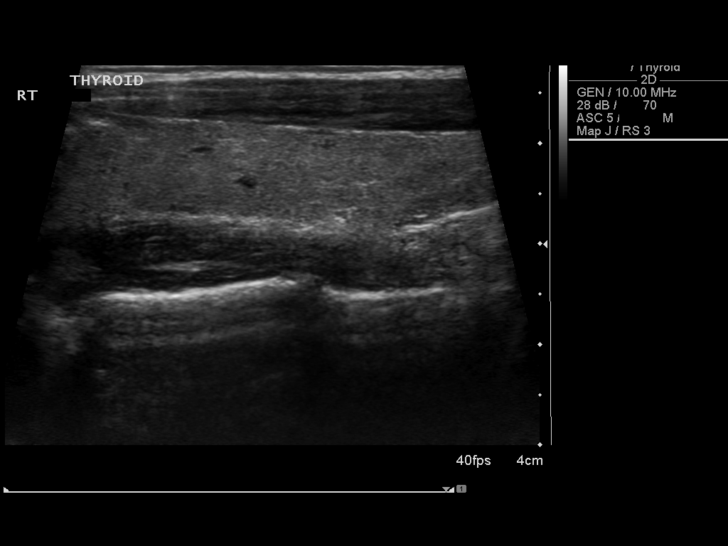
[im 23/42]
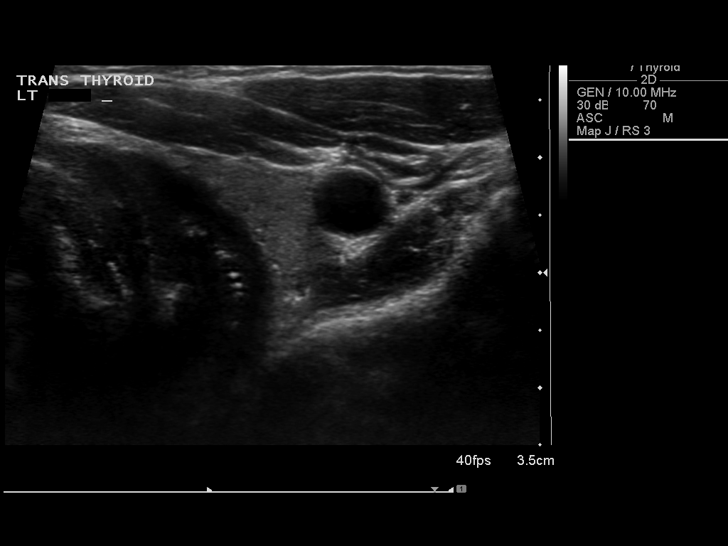
[im 26/42]
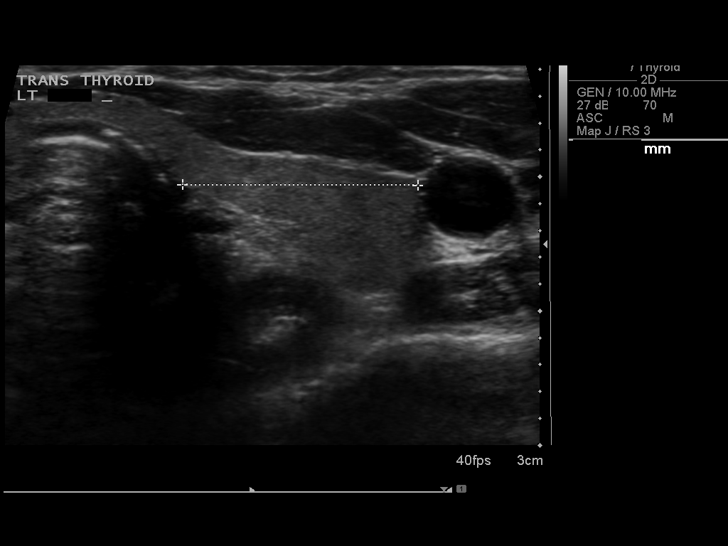
[im 28/42]
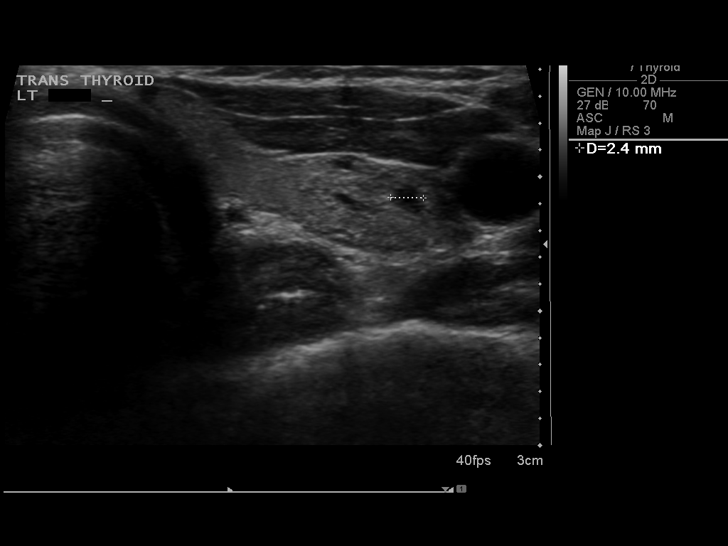
[im 31/42]
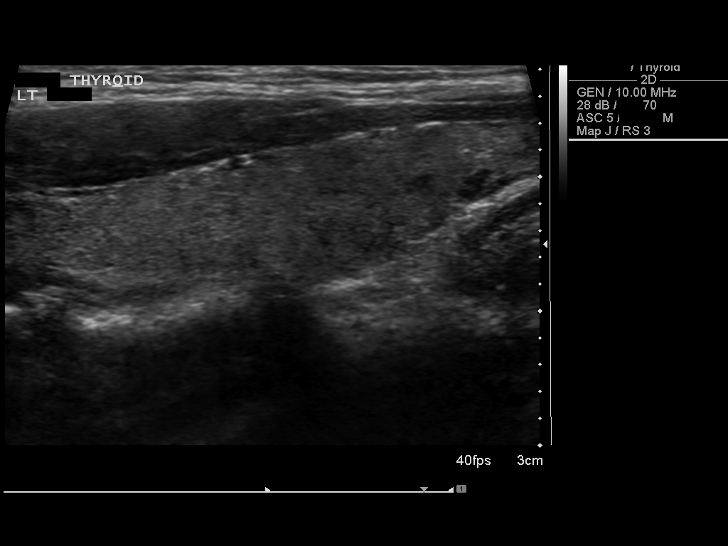
[im 35/42]
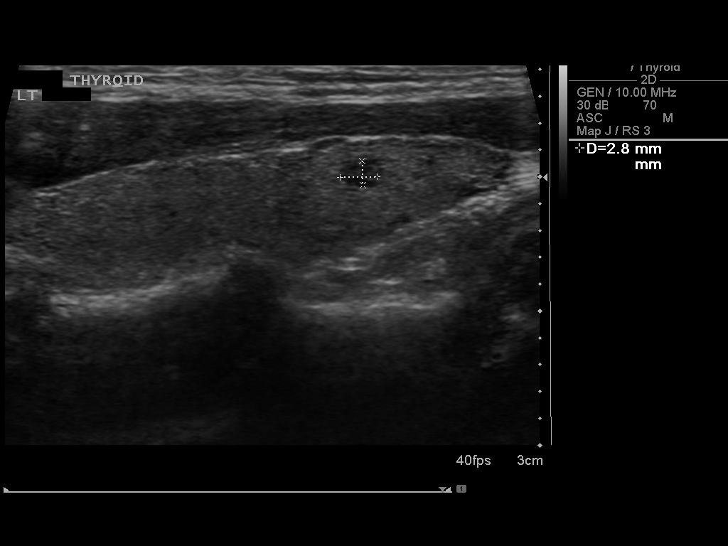
[im 38/42]
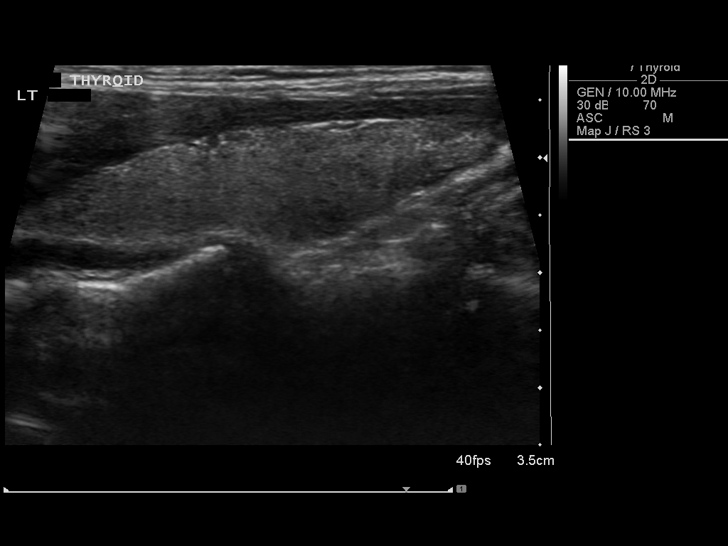
[im 42/42]
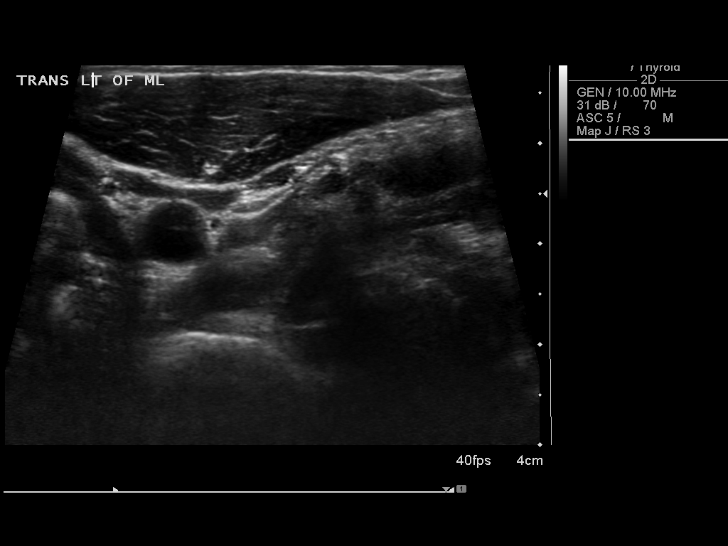

[14 of 25 positions shown; findings below may reference images not displayed]

FINDINGS: Right thyroid lobe

Measurements: 4.9 x 0.9 x 1.7 cm. Small scattered hypoechoic areas
measure 2 mm or less in size.

Left thyroid lobe

Measurements: 4.3 x 1.1 x 1.8 cm. Small hypoechoic nodules measure 3
mm or less in size.

Isthmus

Thickness: 2 mm.  No nodules visualized.

Lymphadenopathy

None visualized.
IMPRESSION: Small bilateral hypoechoic thyroid lesions measure 3 mm or less in
size. There is no evidence of thyroid gland enlargement. Findings do
not meet current SRU consensus criteria for biopsy. Follow-up by
clinical exam is recommended. If patient has known risk factors for
thyroid carcinoma, consider follow-up ultrasound in 12 months. If
patient is clinically hyperthyroid, consider nuclear medicine
thyroid uptake and scan.Reference: Management of Thyroid Nodules
Detected at US: Society of Radiologists in Ultrasound Consensus

## 2016-08-02 ENCOUNTER — Ambulatory Visit (INDEPENDENT_AMBULATORY_CARE_PROVIDER_SITE_OTHER): Payer: 59 | Admitting: Internal Medicine

## 2016-08-02 ENCOUNTER — Encounter: Payer: Self-pay | Admitting: Internal Medicine

## 2016-08-02 VITALS — BP 90/70 | HR 49 | Temp 97.1°F | Ht 64.0 in | Wt 142.5 lb

## 2016-08-02 DIAGNOSIS — Z0000 Encounter for general adult medical examination without abnormal findings: Secondary | ICD-10-CM

## 2016-08-02 DIAGNOSIS — Q909 Down syndrome, unspecified: Secondary | ICD-10-CM | POA: Diagnosis not present

## 2016-08-02 DIAGNOSIS — R718 Other abnormality of red blood cells: Secondary | ICD-10-CM

## 2016-08-02 DIAGNOSIS — E559 Vitamin D deficiency, unspecified: Secondary | ICD-10-CM

## 2016-08-02 DIAGNOSIS — E786 Lipoprotein deficiency: Secondary | ICD-10-CM | POA: Diagnosis not present

## 2016-08-02 DIAGNOSIS — E039 Hypothyroidism, unspecified: Secondary | ICD-10-CM | POA: Diagnosis not present

## 2016-08-02 MED ORDER — SYNTHROID 25 MCG PO TABS: 37.5000 ug | ORAL_TABLET | Freq: Every day | ORAL | 12 refills | Status: DC

## 2016-08-02 NOTE — Progress Notes (Signed)
Chief Complaint  Patient presents with  . Annual Exam    HPI: Patient  Roy Carr  31 y.o. comes in today for Preventive Health Care visit  And Chronic disease management  Here with mom    Skin    stil problematic now seeing derm dr Roy Carr   Scalp better now w head and shoulders   Skin legs off and on   Did have yeast in gu area  rx with  Powder yeast and better .  Eating healthy and exercising swim weekly and more in  Rest of year   Health Maintenance  Topic Date Due  . HIV Screening  08/02/2017 (Originally 08/20/2002)  . INFLUENZA VACCINE  11/13/2016  . TETANUS/TDAP  03/03/2017   Health Maintenance Review LIFESTYLE:  Exercise:  Swim walk Tobacco/ETS:n Alcohol: n Sugar beverages:n Sleep:9 hours no osa Drug use: no Work:  [employer] and more  Time  Healthy eating      ROS:  doesn ot appear to have back discomfort or limitations  Dental  Health  Permanent retainer .   Sees eye doc  Glasses  GEN/ HEENT: No fever, significant weight changes sweats headacheshearing changes, CV/ PULM; No chest pain shortness of breath cough, syncope,edema  change in exercise tolerance. GI /GU: No adominal pain, vomiting, change in bowel habits. No blood in the stool. No significant GU symptoms. SKIN/HEME: ,no acute skin rashes suspicious lesions or bleeding. No lymphadenopathy, nodules, masses.  NEURO/ PSYCH:  No  New neurologic signs such as weakness numbness. IMM/ Allergy: No unusual infections.  Allergy .   REST of 12 system review negative except as per HPI   Past Medical History:  Diagnosis Date  . Hypothyroid    prev per Dr Tobe Sos   . Personal history of spine surgery   . Spondylisthesis    grade 2 corrected  . Trisomy 21   . Varicose veins    intervention 5 2011    Past Surgical History:  Procedure Laterality Date  . ABLATION SAPHENOUS VEIN W/ RFA    . SPINE SURGERY    . WISDOM TOOTH EXTRACTION      Family History  Problem Relation Age of Onset  . Melanoma Father       Social History   Social History  . Marital status: Single    Spouse name: N/A  . Number of children: N/A  . Years of education: N/A   Social History Main Topics  . Smoking status: Never Smoker  . Smokeless tobacco: Never Used  . Alcohol use No  . Drug use: No  . Sexual activity: Not Asked   Other Topics Concern  . 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 erythromycin ophthalmic ointment Place 1 application into the left eye 4 (four) times daily. 3.5 g 0  . valACYclovir (VALTREX) 1000 MG tablet TAKE 2 TABLETS BY MOUTH TWICE DAILY FOR COLD SORES 30 tablet PRN  . SYNTHROID 25 MCG tablet Take 1.5 tablets (37.5 mcg total) by mouth daily.  45 tablet 8  . Wound Dressings (ALEVICYN ANTIPRURITIC) GEL Apply a small amount of topical emollient to the affected area and rub in gently as needed. 170 g 0   No facility-administered medications prior to visit.      EXAM:  BP 90/70 (BP Location: Right Arm, Patient Position: Sitting, Cuff Size: Normal)   Pulse (!) 49   Temp 97.1 F (36.2 C) (Oral)   Ht 5\' 4"  (1.626 m)   Wt 142 lb 8 oz (64.6 kg)   BMI 24.46 kg/m   Body mass index is 24.46 kg/m. Wt Readings from Last 3 Encounters:  08/02/16 142 lb 8 oz (64.6 kg)  07/20/15 142 lb (64.4 kg)  07/14/14 142 lb (64.4 kg)    Physical Exam: Vital signs reviewed [id number] is a well-developed well-nourished alert cooperative    who appearsr stated age in no acute distress.  Typical pleasant downs  Habitus and facies   Interactive  HEENT: micro cephalic atraumatic , Eyes: PERRL EOM's full, conjunctiva clear, Nares: paten,t no deformity discharge or  tenderness., Ears: no deformity EAC's clear TMs with normal landmarks. Mouth: high arech .  Moist mucous membranes. Dentition in adequate repair. NECK: supple without masses,  gfull no masses  No nodules  or bruits. CHEST/PULM:  Clear to auscultation and percussion breath sounds equal no wheeze , rales or rhonchi.  Breast: normal by inspection . No dimpling, discharge, masses, tenderness or discharge . CV: PMI is nondisplaced, S1 S2 no gallops, murmurs, rubs rate 60 . Peripheral pulses are full without delay.No JVD .  ABDOMEN: Bowel sounds normal nontender  No guard or rebound, no hepato splenomegal no CVA tenderness.  tcklish  No obv masses   gu no adenopathy test dec bil full scrotum  Non tender  Extremtities:  No clubbing cyanosis or edema, no acute joint swelling or redness no focal atrophy  Downs digits  Habitus back well healed scar  NEURO:  Non focal  Listens commands cooperative  Nonsense speech /  No limping  some stiffness achilles  SKIN: No acute rashes dry  Few follicular lesion tin thights, color, no bruising or petechiae.  Dry skin   LN: no cervical axillary inguinal adenopathy  Lab Results  Component Value Date   WBC 5.2 07/26/2016   HGB 15.2 07/26/2016   HCT 44.9 07/26/2016   PLT 225.0 07/26/2016   GLUCOSE 85 07/26/2016   CHOL 204 (H) 07/26/2016   TRIG 141.0 07/26/2016   HDL 35.20 (L) 07/26/2016   LDLDIRECT 160.4 07/03/2012   LDLCALC 141 (H) 07/26/2016   ALT 16 07/26/2016   AST 14 07/26/2016   NA 140 07/26/2016   K 4.2 07/26/2016   CL 105 07/26/2016   CREATININE 0.78 07/26/2016   BUN 12 07/26/2016   CO2 30 07/26/2016   TSH 3.10 07/26/2016    BP Readings from Last 3 Encounters:  08/02/16 90/70  07/20/15 102/70  07/14/14 100/70    Lab results reviewed with patient   ASSESSMENT AND PLAN:  Discussed the following assessment and plan:  Visit for preventive health examination  Hypothyroidism, unspecified type  Low HDL (under 40)  Trisomy 21  Elevated  MCV  Vitamin D deficiency possible    - ok to take 1000 supp and check level next year  Next year  Update td ?  continue lsi   For above  No change in dose   Inc mcv  Plan b12 and bit d next blood testing taking vitamins reg and vit d  1000 for now    Patient Care Team: Roy Medin, MD as PCP - General Calvert Cantor, MD as Attending Physician (Ophthalmology) Danella Sensing, MD as Consulting Physician (Dermatology) Patient Instructions  Stay on same dose medication  Continue lifestyle intervention healthy eating and exercise .   Check up   in a year .     Standley Brooking. Panosh M.D.

## 2016-08-02 NOTE — Patient Instructions (Signed)
Stay on same dose medication  Continue lifestyle intervention healthy eating and exercise .   Check up   in a year .

## 2016-08-08 MED FILL — SYNTHROID 25 MCG TABLET: 25 | 30 days supply | Qty: 45 | Fill #0

## 2016-08-09 DIAGNOSIS — L738 Other specified follicular disorders: Secondary | ICD-10-CM | POA: Diagnosis not present

## 2016-08-09 DIAGNOSIS — L858 Other specified epidermal thickening: Secondary | ICD-10-CM | POA: Diagnosis not present

## 2016-08-09 MED FILL — CLINDAMYCIN PH 1% SOLUTION: 1 | 20 days supply | Qty: 60 | Fill #0

## 2016-08-09 MED FILL — DOXYCYCLINE HYCLATE 100 MG: 100 | 10 days supply | Qty: 20 | Fill #0

## 2016-09-10 MED FILL — valACYclovir HCL 1 GM TABS: 1 | 8 days supply | Qty: 30 | Fill #2

## 2016-09-10 MED FILL — SYNTHROID 25 MCG TABLET: 25 | 30 days supply | Qty: 45 | Fill #1

## 2016-10-11 MED FILL — SYNTHROID 25 MCG TABLET: 25 | 30 days supply | Qty: 45 | Fill #2

## 2016-11-11 MED FILL — SYNTHROID 25 MCG TABLET: 25 | 30 days supply | Qty: 45 | Fill #3

## 2016-12-10 MED FILL — SYNTHROID 25 MCG TABLET: 25 | 30 days supply | Qty: 45 | Fill #4

## 2017-01-03 ENCOUNTER — Encounter: Payer: Self-pay | Admitting: Internal Medicine

## 2017-01-09 MED FILL — SYNTHROID 25 MCG TABLET: 25 | 30 days supply | Qty: 45 | Fill #5

## 2017-02-10 MED FILL — SYNTHROID 25 MCG TABLET: 25 | 30 days supply | Qty: 45 | Fill #6

## 2017-03-10 MED FILL — SYNTHROID 25 MCG TABLET: 25 | 30 days supply | Qty: 45 | Fill #7

## 2017-04-09 MED FILL — SYNTHROID 25 MCG TABLET: 25 | 30 days supply | Qty: 45 | Fill #8

## 2017-05-12 MED FILL — SYNTHROID 25 MCG TABLET: 25 | 30 days supply | Qty: 45 | Fill #9

## 2017-06-09 MED FILL — SYNTHROID 25 MCG TABLET: 25 | 30 days supply | Qty: 45 | Fill #10

## 2017-07-02 DIAGNOSIS — H5213 Myopia, bilateral: Secondary | ICD-10-CM | POA: Diagnosis not present

## 2017-07-02 DIAGNOSIS — H52223 Regular astigmatism, bilateral: Secondary | ICD-10-CM | POA: Diagnosis not present

## 2017-07-08 ENCOUNTER — Other Ambulatory Visit: Payer: Self-pay | Admitting: Internal Medicine

## 2017-07-08 MED FILL — SYNTHROID 25 MCG TABLET: 25 | 30 days supply | Qty: 45 | Fill #11

## 2017-07-08 MED FILL — valACYclovir HCL 1 GM TABS: 1 | 8 days supply | Qty: 30 | Fill #0

## 2017-08-08 ENCOUNTER — Other Ambulatory Visit: Payer: Self-pay | Admitting: Internal Medicine

## 2017-08-08 MED FILL — SYNTHROID 25 MCG TABLET: 25 | 30 days supply | Qty: 45 | Fill #0

## 2017-08-15 NOTE — Progress Notes (Signed)
Chief Complaint  Patient presents with  . Annual Exam    No new concerns    HPI: Patient  Roy Carr  32 y.o. comes in today for Preventive Health Care visit  He has trisomy 99 functioning well with adult supervision   On thryoid medication replacement. Since last visit no major changes   Sea salt baths helping his folliculitis   And skin more than derm preps  Swims reg  Special olympics   utd on eye dental and hearing ok .  Health Maintenance  Topic Date Due  . HIV Screening  08/20/2002  . TETANUS/TDAP  03/03/2017  . INFLUENZA VACCINE  11/13/2017   Health Maintenance Review LIFESTYLE:  Exercise:  Walking swimming special o;lym Tobacco/ETS: no Alcohol:  no Sugar beverages: tyr not to  Sleep: ok Drug use: no HH of 3 1 dog  Work:9-3  X 5 days.  Multi vit    And 3 ocass  ROS:  GEN/ HEENT: No fever, significant weight changes sweats headaches vision problems hearing changes, CV/ PULM; No chest pain shortness of breath cough, syncope,edema  change in exercise tolerance. GI /GU: No adominal pain, vomiting, change in bowel habits. No blood in the stool. No significant GU symptoms. SKIN/HEME: ,no acute skin rashes suspicious lesions or bleeding. No lymphadenopathy, nodules, masses.  See above dandrugff better   Recurrent folliculitis upper thrights groin  NEURO/ PSYCH:  No neurologic signs such as weakness numbness. No depression anxiety. IMM/ Allergy: No unusual infections.  Allergy .   REST of 12 system review negative except as per HPI   Past Medical History:  Diagnosis Date  . Hypothyroid    prev per Dr Tobe Sos   . Personal history of spine surgery   . Spondylisthesis    grade 2 corrected  . Trisomy 21   . Varicose veins    intervention 5 2011    Past Surgical History:  Procedure Laterality Date  . ABLATION SAPHENOUS VEIN W/ RFA    . SPINE SURGERY    . WISDOM TOOTH EXTRACTION      Family History  Problem Relation Age of Onset  . Melanoma Father      Social History   Socioeconomic History  . Marital status: Single    Spouse name: Not on file  . Number of children: Not on file  . Years of education: Not on file  . Highest education level: Not on file  Occupational History  . Not on file  Social Needs  . Financial resource strain: Not on file  . Food insecurity:    Worry: Not on file    Inability: Not on file  . Transportation needs:    Medical: Not on file    Non-medical: Not on file  Tobacco Use  . Smoking status: Never Smoker  . Smokeless tobacco: Never Used  Substance and Sexual Activity  . Alcohol use: No  . Drug use: No  . Sexual activity: Not on file  Lifestyle  . Physical activity:    Days per week: Not on file    Minutes per session: Not on file  . Stress: Not on file  Relationships  . Social connections:    Talks on phone: Not on file    Gets together: Not on file    Attends religious service: Not on file    Active member of club or organization: Not on file    Attends meetings of clubs or organizations: Not on file  Relationship status: Not on file  Other Topics Concern  . 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 SYNTHROID 25 MCG tablet TAKE 1 AND 1/2 TABLETS (37.5 MCG TOTAL) BY MOUTH DAILY 45 tablet 1  . valACYclovir (VALTREX) 1000 MG tablet TAKE 2 TABLETS BY MOUTH TWICE DAILY FOR COLD SORES 30 tablet PRN  . erythromycin ophthalmic ointment Place 1 application into the left eye 4 (four) times daily. (Patient not taking: Reported on 08/18/2017) 3.5 g 0   No facility-administered medications prior to visit.      EXAM:  BP (!) 94/58 (BP Location: Right Arm,  Patient Position: Sitting, Cuff Size: Normal)   Pulse (!) 55   Temp (!) 97.4 F (36.3 C) (Oral)   Ht 5' 3.75" (1.619 m)   Wt 141 lb 9.6 oz (64.2 kg)   BMI 24.50 kg/m   Body mass index is 24.5 kg/m. Wt Readings from Last 3 Encounters:  08/18/17 141 lb 9.6 oz (64.2 kg)  08/02/16 142 lb 8 oz (64.6 kg)  07/20/15 142 lb (64.4 kg)    Physical Exam: Vital signs reviewed [id number] is a well-developed well-nourished alert cooperative    who appearsr stated age in no acute distress.   Downs  Habitus  HEENT:  atraumatic , Eyes: PERRL EOM's full, conjunctiva clear, Nares: paten,t no deformity discharge or tenderness., Ears: no deformity EAC's clear TMs with normal landmarks. Mouth: clear OP, no lesions, edema.  Moist mucous membranes. Dentition in adequate repair.  Upper  Retainer  NECK: supple without masses, thyromegaly or bruits. CHEST/PULM:  Clear to auscultation and percussion breath sounds equal no wheeze , rales or rhonchi. No chest wall deformities or tenderness. Breast: normal by inspection .  CV: PMI is nondisplaced, S1 S2 no gallops, murmurs, rubs. Peripheral pulses are full without delay.No JVD .  ABDOMEN: Bowel sounds normal nontender  No guard or rebound, no hepato splenomegal no CVA tenderness.   Extremtities:  No clubbing cyanosis or edema, no acute joint swelling or redness no focal atrophy NEURO:  Oriented x3, cranial nerves 3-12 appear to be intact, no obvious focal weakness,wellhelaed scar back  Good rom SKIN: No acute rashes normal turgor, color, no bruising or petechiae. Upper thights with some folliculitis no boils  Dc streaking  PSYCH: , good eye contact,   No speech but pleasant interaction dooperativeLN: no cervical axillary inguinal adenopathy  Lab Results  Component Value Date   WBC 5.2 07/26/2016   HGB 15.2 07/26/2016   HCT 44.9 07/26/2016   PLT 225.0 07/26/2016   GLUCOSE 85 07/26/2016   CHOL 204 (H) 07/26/2016   TRIG 141.0 07/26/2016   HDL 35.20 (L) 07/26/2016     LDLDIRECT 160.4 07/03/2012   LDLCALC 141 (H) 07/26/2016   ALT 16 07/26/2016   AST 14 07/26/2016   NA 140 07/26/2016   K 4.2 07/26/2016   CL 105 07/26/2016   CREATININE 0.78 07/26/2016   BUN 12 07/26/2016   CO2 30 07/26/2016   TSH 3.10 07/26/2016  BP Readings from Last 3 Encounters:  08/18/17 (!) 94/58  08/02/16 90/70  07/20/15 102/70      ASSESSMENT AND PLAN:  Discussed the following assessment and plan:  Visit for preventive health examination - Plan: Vitamin O84, Basic metabolic panel, CBC with Differential/Platelet, Hepatic function panel, Lipid panel, TSH, VITAMIN D 25 Hydroxy (Vit-D Deficiency, Fractures)  Medication management - Plan: Vitamin Z66, Basic metabolic panel, CBC with Differential/Platelet, Hepatic function panel, Lipid panel, TSH, VITAMIN D 25 Hydroxy (Vit-D Deficiency, Fractures)  Hypothyroidism, unspecified type - Plan: Vitamin A63, Basic metabolic panel, CBC with Differential/Platelet, Hepatic function panel, Lipid panel, TSH, VITAMIN D 25 Hydroxy (Vit-D Deficiency, Fractures)  Low HDL (under 40) - Plan: Vitamin K16, Basic metabolic panel, CBC with Differential/Platelet, Hepatic function panel, Lipid panel, TSH, VITAMIN D 25 Hydroxy (Vit-D Deficiency, Fractures)  Trisomy 21 - Plan: Vitamin W10, Basic metabolic panel, CBC with Differential/Platelet, Hepatic function panel, Lipid panel, TSH, VITAMIN D 25 Hydroxy (Vit-D Deficiency, Fractures)  Elevated MCV - Plan: Vitamin X32, Basic metabolic panel, CBC with Differential/Platelet, Hepatic function panel, Lipid panel, TSH, VITAMIN D 25 Hydroxy (Vit-D Deficiency, Fractures)  Vitamin D deficiency possible    - Plan: Vitamin T55, Basic metabolic panel, CBC with Differential/Platelet, Hepatic function panel, Lipid panel, TSH, VITAMIN D 25 Hydroxy (Vit-D Deficiency, Fractures) No new issues stable  Follow mcv b12 vit d  Exam  Skin disc  Lab monitoring    Yearly check up and lab monitoring  Patient Care  Team: Panosh, Standley Brooking, MD as PCP - General Calvert Cantor, MD as Attending Physician (Ophthalmology) Danella Sensing, MD as Consulting Physician (Dermatology) Patient Instructions   Will notify you  of labs when available.    Continue lifestyle intervention healthy eating and exercise .    Health Maintenance, Male A healthy lifestyle and preventive care is important for your health and wellness. Ask your health care provider about what schedule of regular examinations is right for you. What should I know about weight and diet? Eat a Healthy Diet  Eat plenty of vegetables, fruits, whole grains, low-fat dairy products, and lean protein.  Do not eat a lot of foods high in solid fats, added sugars, or salt.  Maintain a Healthy Weight Regular exercise can help you achieve or maintain a healthy weight. You should:  Do at least 150 minutes of exercise each week. The exercise should increase your heart rate and make you sweat (moderate-intensity exercise).  Do strength-training exercises at least twice a week.  Watch Your Levels of Cholesterol and Blood Lipids  Have your blood tested for lipids and cholesterol every 5 years starting at 30 years of age. If you are at high risk for heart disease, you should start having your blood tested when you are 31 years old. You may need to have your cholesterol levels checked more often if: ? Your lipid or cholesterol levels are high. ? You are older than 30 years of age. ? You are at high risk for heart disease.  What should I know about cancer screening? Many types of cancers can be detected early and may often be prevented. Lung Cancer  You should be screened every year for lung cancer if: ? You are a current smoker who has smoked for at least 30 years. ? You are a former smoker who has quit within the past 15 years.  Talk to your health care provider about your screening options, when you should start screening, and how often you should be  screened.  Colorectal Cancer  Routine colorectal cancer screening usually begins at 30 years of age and should be repeated every 5-10 years until you are 30 years old. You may need to be screened more often if early forms of precancerous polyps or small growths are found. Your health care provider may recommend screening at an earlier age if you have risk factors for colon cancer.  Your health care provider may recommend using home test kits to check for hidden blood in the stool.  A small camera at the end of a tube can be used to examine your colon (sigmoidoscopy or colonoscopy). This checks for the earliest forms of colorectal cancer.  Prostate and Testicular Cancer  Depending on your age and overall health, your health care provider may do certain tests to screen for prostate and testicular cancer.  Talk to your health care provider about any symptoms or concerns you have about testicular or prostate cancer.  Skin Cancer  Check your skin from head to toe regularly.  Tell your health care provider about any new moles or changes in moles, especially if: ? There is a change in a mole's size, shape, or color. ? You have a mole that is larger than a pencil eraser.  Always use sunscreen. Apply sunscreen liberally and repeat throughout the day.  Protect yourself by wearing long sleeves, pants, a wide-brimmed hat, and sunglasses when outside.  What should I know about heart disease, diabetes, and high blood pressure?  If you are 88-58 years of age, have your blood pressure checked every 3-5 years. If you are 45 years of age or older, have your blood pressure checked every year. You should have your blood pressure measured twice-once when you are at a hospital or clinic, and once when you are not at a hospital or clinic. Record the average of the two measurements. To check your blood pressure when you are not at a hospital or clinic, you can use: ? An automated blood pressure machine at a  pharmacy. ? A home blood pressure monitor.  Talk to your health care provider about your target blood pressure.  If you are between 17-55 years old, ask your health care provider if you should take aspirin to prevent heart disease.  Have regular diabetes screenings by checking your fasting blood sugar level. ? If you are at a normal weight and have a low risk for diabetes, have this test once every three years after the age of 45. ? If you are overweight and have a high risk for diabetes, consider being tested at a younger age or more often.  A one-time screening for abdominal aortic aneurysm (AAA) by ultrasound is recommended for men aged 50-75 years who are current or former smokers. What should I know about preventing infection? Hepatitis B If you have a higher risk for hepatitis B, you should be screened for this virus. Talk with your health care provider to find out if you are at risk for hepatitis B infection. Hepatitis C Blood testing is recommended for:  Everyone born from [birth year] through 1963.  Anyone with known risk factors for hepatitis C.  Sexually Transmitted Diseases (STDs)  You should be screened each year for STDs including gonorrhea and chlamydia if: ? You are sexually active and are younger than 30 years of age. ? You are older than 30 years of age and your health care provider tells you that you are at risk for this type of infection. ? Your sexual activity has changed  since you were last screened and you are at an increased risk for chlamydia or gonorrhea. Ask your health care provider if you are at risk.  Talk with your health care provider about whether you are at high risk of being infected with HIV. Your health care provider may recommend a prescription medicine to help prevent HIV infection.  What else can I do?  Schedule regular health, dental, and eye exams.  Stay current with your vaccines (immunizations).  Do not use any tobacco products, such as  cigarettes, chewing tobacco, and e-cigarettes. If you need help quitting, ask your health care provider.  Limit alcohol intake to no more than 2 drinks per day. One drink equals 12 ounces of beer, 5 ounces of wine, or 1 ounces of hard liquor.  Do not use street drugs.  Do not share needles.  Ask your health care provider for help if you need support or information about quitting drugs.  Tell your health care provider if you often feel depressed.  Tell your health care provider if you have ever been abused or do not feel safe at home. This information is not intended to replace advice given to you by your health care provider. Make sure you discuss any questions you have with your health care provider. Document Released: 09/28/2007 Document Revised: 11/29/2015 Document Reviewed: 01/03/2015 Elsevier Interactive Patient Education  2018 [place]. Panosh M.D.

## 2017-08-18 ENCOUNTER — Encounter: Payer: Self-pay | Admitting: Internal Medicine

## 2017-08-18 ENCOUNTER — Ambulatory Visit (INDEPENDENT_AMBULATORY_CARE_PROVIDER_SITE_OTHER): Payer: 59 | Admitting: Internal Medicine

## 2017-08-18 VITALS — BP 94/58 | HR 55 | Temp 97.4°F | Ht 63.75 in | Wt 141.6 lb

## 2017-08-18 DIAGNOSIS — Z79899 Other long term (current) drug therapy: Secondary | ICD-10-CM

## 2017-08-18 DIAGNOSIS — R718 Other abnormality of red blood cells: Secondary | ICD-10-CM | POA: Insufficient documentation

## 2017-08-18 DIAGNOSIS — Q909 Down syndrome, unspecified: Secondary | ICD-10-CM

## 2017-08-18 DIAGNOSIS — E039 Hypothyroidism, unspecified: Secondary | ICD-10-CM

## 2017-08-18 DIAGNOSIS — Z0000 Encounter for general adult medical examination without abnormal findings: Secondary | ICD-10-CM | POA: Diagnosis not present

## 2017-08-18 DIAGNOSIS — E786 Lipoprotein deficiency: Secondary | ICD-10-CM | POA: Diagnosis not present

## 2017-08-18 DIAGNOSIS — E559 Vitamin D deficiency, unspecified: Secondary | ICD-10-CM

## 2017-08-18 LAB — CBC WITH DIFFERENTIAL/PLATELET
BASOS ABS: 0.1 10*3/uL (ref 0.0–0.1)
Basophils Relative: 1.4 % (ref 0.0–3.0)
Eosinophils Absolute: 0 10*3/uL (ref 0.0–0.7)
Eosinophils Relative: 0.6 % (ref 0.0–5.0)
HCT: 43 % (ref 39.0–52.0)
Hemoglobin: 14.8 g/dL (ref 13.0–17.0)
LYMPHS ABS: 1.3 10*3/uL (ref 0.7–4.0)
LYMPHS PCT: 29.3 % (ref 12.0–46.0)
MCHC: 34.4 g/dL (ref 30.0–36.0)
MCV: 101.7 fl — AB (ref 78.0–100.0)
MONOS PCT: 12.1 % — AB (ref 3.0–12.0)
Monocytes Absolute: 0.5 10*3/uL (ref 0.1–1.0)
NEUTROS PCT: 56.6 % (ref 43.0–77.0)
Neutro Abs: 2.6 10*3/uL (ref 1.4–7.7)
Platelets: 280 10*3/uL (ref 150.0–400.0)
RBC: 4.22 Mil/uL (ref 4.22–5.81)
RDW: 13 % (ref 11.5–15.5)
WBC: 4.5 10*3/uL (ref 4.0–10.5)

## 2017-08-18 LAB — LIPID PANEL
CHOL/HDL RATIO: 6
Cholesterol: 192 mg/dL (ref 0–200)
HDL: 29.9 mg/dL — ABNORMAL LOW (ref >39.00)
LDL CALC: 128 mg/dL — AB (ref 0–99)
NONHDL: 161.69
Triglycerides: 166 mg/dL — ABNORMAL HIGH (ref 0.0–149.0)
VLDL: 33.2 mg/dL (ref 0.0–40.0)

## 2017-08-18 LAB — BASIC METABOLIC PANEL
BUN: 12 mg/dL (ref 6–23)
CALCIUM: 9.4 mg/dL (ref 8.4–10.5)
CO2: 30 mEq/L (ref 19–32)
Chloride: 103 mEq/L (ref 96–112)
Creatinine, Ser: 0.81 mg/dL (ref 0.40–1.50)
GFR: 118.93 mL/min (ref >60.00)
Glucose, Bld: 69 mg/dL — ABNORMAL LOW (ref 70–99)
Potassium: 4.6 mEq/L (ref 3.5–5.1)
SODIUM: 139 meq/L (ref 135–145)

## 2017-08-18 LAB — HEPATIC FUNCTION PANEL
ALBUMIN: 3.9 g/dL (ref 3.5–5.2)
ALK PHOS: 73 U/L (ref 39–117)
ALT: 16 U/L (ref 0–53)
AST: 15 U/L (ref 0–37)
BILIRUBIN DIRECT: 0.1 mg/dL (ref 0.0–0.3)
Total Bilirubin: 0.9 mg/dL (ref 0.2–1.2)
Total Protein: 6.8 g/dL (ref 6.0–8.3)

## 2017-08-18 LAB — TSH: TSH: 2.2 u[IU]/mL (ref 0.35–4.50)

## 2017-08-18 LAB — VITAMIN D 25 HYDROXY (VIT D DEFICIENCY, FRACTURES): VITD: 22.55 ng/mL — ABNORMAL LOW (ref 30.00–100.00)

## 2017-08-18 LAB — VITAMIN B12: VITAMIN B 12: 321 pg/mL (ref 211–911)

## 2017-08-18 NOTE — Patient Instructions (Addendum)
Will notify you  of labs when available.    Continue lifestyle intervention healthy eating and exercise .    Health Maintenance, Male A healthy lifestyle and preventive care is important for your health and wellness. Ask your health care provider about what schedule of regular examinations is right for you. What should I know about weight and diet? Eat a Healthy Diet  Eat plenty of vegetables, fruits, whole grains, low-fat dairy products, and lean protein.  Do not eat a lot of foods high in solid fats, added sugars, or salt.  Maintain a Healthy Weight Regular exercise can help you achieve or maintain a healthy weight. You should:  Do at least 150 minutes of exercise each week. The exercise should increase your heart rate and make you sweat (moderate-intensity exercise).  Do strength-training exercises at least twice a week.  Watch Your Levels of Cholesterol and Blood Lipids  Have your blood tested for lipids and cholesterol every 5 years starting at 30 years of age. If you are at high risk for heart disease, you should start having your blood tested when you are 30 years old. You may need to have your cholesterol levels checked more often if: ? Your lipid or cholesterol levels are high. ? You are older than 30 years of age. ? You are at high risk for heart disease.  What should I know about cancer screening? Many types of cancers can be detected early and may often be prevented. Lung Cancer  You should be screened every year for lung cancer if: ? You are a current smoker who has smoked for at least 30 years. ? You are a former smoker who has quit within the past 15 years.  Talk to your health care provider about your screening options, when you should start screening, and how often you should be screened.  Colorectal Cancer  Routine colorectal cancer screening usually begins at 30 years of age and should be repeated every 5-10 years until you are 30 years old. You may need  to be screened more often if early forms of precancerous polyps or small growths are found. Your health care provider may recommend screening at an earlier age if you have risk factors for colon cancer.  Your health care provider may recommend using home test kits to check for hidden blood in the stool.  A small camera at the end of a tube can be used to examine your colon (sigmoidoscopy or colonoscopy). This checks for the earliest forms of colorectal cancer.  Prostate and Testicular Cancer  Depending on your age and overall health, your health care provider may do certain tests to screen for prostate and testicular cancer.  Talk to your health care provider about any symptoms or concerns you have about testicular or prostate cancer.  Skin Cancer  Check your skin from head to toe regularly.  Tell your health care provider about any new moles or changes in moles, especially if: ? There is a change in a mole's size, shape, or color. ? You have a mole that is larger than a pencil eraser.  Always use sunscreen. Apply sunscreen liberally and repeat throughout the day.  Protect yourself by wearing long sleeves, pants, a wide-brimmed hat, and sunglasses when outside.  What should I know about heart disease, diabetes, and high blood pressure?  If you are 30-71 years of age, have your blood pressure checked every 3-5 years. If you are 30 years of age or older, have your blood pressure  checked every year. You should have your blood pressure measured twice-once when you are at a hospital or clinic, and once when you are not at a hospital or clinic. Record the average of the two measurements. To check your blood pressure when you are not at a hospital or clinic, you can use: ? An automated blood pressure machine at a pharmacy. ? A home blood pressure monitor.  Talk to your health care provider about your target blood pressure.  If you are between 30-57 years old, ask your health care provider if  you should take aspirin to prevent heart disease.  Have regular diabetes screenings by checking your fasting blood sugar level. ? If you are at a normal weight and have a low risk for diabetes, have this test once every three years after the age of 38. ? If you are overweight and have a high risk for diabetes, consider being tested at a younger age or more often.  A one-time screening for abdominal aortic aneurysm (AAA) by ultrasound is recommended for men aged 62-75 years who are current or former smokers. What should I know about preventing infection? Hepatitis B If you have a higher risk for hepatitis B, you should be screened for this virus. Talk with your health care provider to find out if you are at risk for hepatitis B infection. Hepatitis C Blood testing is recommended for:  Everyone born from [birth year] through 1963.  Anyone with known risk factors for hepatitis C.  Sexually Transmitted Diseases (STDs)  You should be screened each year for STDs including gonorrhea and chlamydia if: ? You are sexually active and are younger than 30 years of age. ? You are older than 30 years of age and your health care provider tells you that you are at risk for this type of infection. ? Your sexual activity has changed since you were last screened and you are at an increased risk for chlamydia or gonorrhea. Ask your health care provider if you are at risk.  Talk with your health care provider about whether you are at high risk of being infected with HIV. Your health care provider may recommend a prescription medicine to help prevent HIV infection.  What else can I do?  Schedule regular health, dental, and eye exams.  Stay current with your vaccines (immunizations).  Do not use any tobacco products, such as cigarettes, chewing tobacco, and e-cigarettes. If you need help quitting, ask your health care provider.  Limit alcohol intake to no more than 2 drinks per day. One drink equals 12 ounces of  beer, 5 ounces of wine, or 1 ounces of hard liquor.  Do not use street drugs.  Do not share needles.  Ask your health care provider for help if you need support or information about quitting drugs.  Tell your health care provider if you often feel depressed.  Tell your health care provider if you have ever been abused or do not feel safe at home. This information is not intended to replace advice given to you by your health care provider. Make sure you discuss any questions you have with your health care provider. Document Released: 09/28/2007 Document Revised: 11/29/2015 Document Reviewed: 01/03/2015 Elsevier Interactive Patient Education  [employer].

## 2017-09-09 MED FILL — SYNTHROID 25 MCG TABLET: 25 | 30 days supply | Qty: 45 | Fill #1

## 2017-10-07 ENCOUNTER — Other Ambulatory Visit: Payer: Self-pay | Admitting: Internal Medicine

## 2017-10-08 MED FILL — SYNTHROID 25 MCG TABLET: 25 | 30 days supply | Qty: 45 | Fill #0

## 2017-10-08 NOTE — Telephone Encounter (Signed)
Sent to the pharmacy by e-scribe.  Last TSH on 08/18/17 and normal.

## 2017-11-06 MED FILL — SYNTHROID 25 MCG TABLET: 25 | 30 days supply | Qty: 45 | Fill #1

## 2017-12-08 MED FILL — SYNTHROID 25 MCG TABLET: 25 | 30 days supply | Qty: 45 | Fill #2

## 2017-12-08 MED FILL — valACYclovir HCL 1 GM TABS: 1 | 8 days supply | Qty: 30 | Fill #1

## 2017-12-29 ENCOUNTER — Telehealth: Payer: Self-pay | Admitting: Internal Medicine

## 2017-12-29 NOTE — Telephone Encounter (Signed)
Copied from [place] [id number]. Topic: Quick Communication - See Telephone Encounter >> Dec 29, 2017 12:24 PM Vernona Rieger wrote: CRM for notification. See Telephone encounter for: 12/29/17.  Patient's mother called and said that he has been losing hair in his scalp ( about 6 weeks ), she says they are about the size of a quarter. She is wondering if he is on too much SYNTHROID 25 MCG tablet. She also thinks that he has lost a little weight as well in the last 6 weeks, about 4-5 pounds. She would like Dr Regis Bill to give her a call.

## 2017-12-29 NOTE — Telephone Encounter (Signed)
Please advise Dr Panosh, thanks.   

## 2017-12-31 ENCOUNTER — Encounter: Payer: Self-pay | Admitting: Internal Medicine

## 2017-12-31 ENCOUNTER — Ambulatory Visit (INDEPENDENT_AMBULATORY_CARE_PROVIDER_SITE_OTHER): Payer: 59 | Admitting: Internal Medicine

## 2017-12-31 VITALS — BP 102/64 | HR 75 | Temp 97.7°F | Wt 137.4 lb

## 2017-12-31 DIAGNOSIS — L659 Nonscarring hair loss, unspecified: Secondary | ICD-10-CM

## 2017-12-31 DIAGNOSIS — Z79899 Other long term (current) drug therapy: Secondary | ICD-10-CM | POA: Diagnosis not present

## 2017-12-31 DIAGNOSIS — Q909 Down syndrome, unspecified: Secondary | ICD-10-CM

## 2017-12-31 DIAGNOSIS — J069 Acute upper respiratory infection, unspecified: Secondary | ICD-10-CM

## 2017-12-31 DIAGNOSIS — E039 Hypothyroidism, unspecified: Secondary | ICD-10-CM | POA: Diagnosis not present

## 2017-12-31 LAB — CBC WITH DIFFERENTIAL/PLATELET
BASOS ABS: 0.1 10*3/uL (ref 0.0–0.1)
BASOS PCT: 0.9 % (ref 0.0–3.0)
Eosinophils Absolute: 0 10*3/uL (ref 0.0–0.7)
Eosinophils Relative: 0.3 % (ref 0.0–5.0)
HCT: 42 % (ref 39.0–52.0)
Hemoglobin: 14.5 g/dL (ref 13.0–17.0)
LYMPHS ABS: 1.3 10*3/uL (ref 0.7–4.0)
Lymphocytes Relative: 13 % (ref 12.0–46.0)
MCHC: 34.5 g/dL (ref 30.0–36.0)
MCV: 99 fl (ref 78.0–100.0)
Monocytes Absolute: 1.2 10*3/uL — ABNORMAL HIGH (ref 0.1–1.0)
Monocytes Relative: 11.8 % (ref 3.0–12.0)
NEUTROS ABS: 7.4 10*3/uL (ref 1.4–7.7)
NEUTROS PCT: 74 % (ref 43.0–77.0)
PLATELETS: 302 10*3/uL (ref 150.0–400.0)
RBC: 4.25 Mil/uL (ref 4.22–5.81)
RDW: 13.2 % (ref 11.5–15.5)
WBC: 10 10*3/uL (ref 4.0–10.5)

## 2017-12-31 LAB — TSH: TSH: 1.32 u[IU]/mL (ref 0.35–4.50)

## 2017-12-31 LAB — T4, FREE: Free T4: 1.02 ng/dL (ref 0.60–1.60)

## 2017-12-31 NOTE — Patient Instructions (Signed)
Will notify you  of labs when available.  This may be an early  version of  Alopecia aerata .   Wt Readings from Last 3 Encounters:  12/31/17 137 lb 6.4 oz (62.3 kg)  08/18/17 141 lb 9.6 oz (64.2 kg)  08/02/16 142 lb 8 oz (64.6 kg)   Advise next step would be   Seeing dermatologist .

## 2017-12-31 NOTE — Telephone Encounter (Signed)
Pt seen in office today. Nothing further needed.

## 2017-12-31 NOTE — Progress Notes (Signed)
Chief Complaint  Patient presents with  . Alopecia    New hair loss, dime to quarter size spots, weight loss and slight tremor. Mom states that tremor has been present for several years - worsening. Pt currently has a cold and has been more fatigued than normal    HPI: Roy Carr 32 y.o. come in fornew problem with mom  See  Phone note  For the past 3-4 weeks has noted   thinn ing spots mostly roundest on scalp  Without  Rash new  Head sx or new meds   Is on brand synthroid .  recenet uri low grade fever getting better .  Noted 4 # wight loss      Eating the same no other illness .    ROS: See pertinent positives and negatives per HPI. No other fever chills  Joint swelling  Other changes  Has fine trmor right hand for a while no weakness or progression.   Past Medical History:  Diagnosis Date  . Hypothyroid    prev per Dr Tobe Sos   . Personal history of spine surgery   . Spondylisthesis    grade 2 corrected  . Trisomy 21   . Varicose veins    intervention 5 2011    Family History  Problem Relation Age of Onset  . Melanoma Father     Social History   Socioeconomic History  . Marital status: Single    Spouse name: Not on file  . Number of children: Not on file  . Years of education: Not on file  . Highest education level: Not on file  Occupational History  . Not on file  Social Needs  . Financial resource strain: Not on file  . Food insecurity:    Worry: Not on file    Inability: Not on file  . Transportation needs:    Medical: Not on file    Non-medical: Not on file  Tobacco Use  . Smoking status: Never Smoker  . Smokeless tobacco: Never Used  Substance and Sexual Activity  . Alcohol use: No  . Drug use: No  . Sexual activity: Not on file  Lifestyle  . Physical activity:    Days per week: Not on file    Minutes per session: Not on file  . Stress: Not on file  Relationships  . Social connections:    Talks on phone: Not on file    Gets together:  Not on file    Attends religious service: Not on file    Active member of club or organization: Not on file    Attends meetings of clubs or organizations: Not on file    Relationship status: Not on file  Other Topics Concern  . 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 SYNTHROID 25 MCG tablet TAKE 1 AND 1/2 TABLETS (37.5 MCG TOTAL) BY MOUTH DAILY 135 tablet 2  . valACYclovir (VALTREX) 1000 MG tablet TAKE 2 TABLETS BY MOUTH TWICE DAILY FOR COLD SORES 30 tablet  PRN   No facility-administered medications prior to visit.      EXAM:  BP 102/64 (BP Location: Right Arm, Patient Position: Sitting, Cuff Size: Normal)   Pulse 75   Temp 97.7 F (36.5 C) (Oral)   Wt 137 lb 6.4 oz (62.3 kg)   SpO2 97%   BMI 23.77 kg/m   Body mass index is 23.77 kg/m.  GENERAL: vitals reviewed and listed above, alert, oriented, appears well hydrated and in no acute distress  Nasal congested and  Cooperative down habitus well groomed  HEENT: atraumatic, conjunctiva  clear, no obvious abnormalities on inspection of external nose and ears  NECK: no obvious masses on inspection palpation  LUNGS: clear to auscultation bilaterally, no wheezes, rales or rhonchi, good air movement upper airway sounds  CV: HRRR, no clubbing cyanosis o nl cap refill  Abdomen:  Sof,t normal bowel sounds without hepatosplenomegaly, no guarding rebound or masses no CVA tenderness MS: moves all extremities without noticeable focal   Acute abnormality Scalp no lesion  But munoiurs round thinning patches  And no rash scarring or  Black dot  At base .  Mild vertex thinning( old)   BP Readings from Last  3 Encounters:  12/31/17 102/64  08/18/17 (!) 94/58  08/02/16 90/70    ASSESSMENT AND PLAN:  Discussed the following assessment and plan:  Alopecia - suspecious for  AA  underlying risk downs and thyroid uri today seems not related  lab today and plan derm as next step.  - Plan: TSH, T4, free, CBC with Differential/Platelet, ANA, Thyroid peroxidase antibody  Medication management - Plan: TSH, T4, free, CBC with Differential/Platelet, ANA, Thyroid peroxidase antibody  Hypothyroidism, unspecified type - Plan: TSH, T4, free, CBC with Differential/Platelet, ANA, Thyroid peroxidase antibody  Trisomy 21  URI, acute  Lab today  Unrevealing exan x uri and scalp    And plan mom to work on getting derm appt but if needed contact us if problematic with this.  -Patient advised to return or notify health care team  if  new concerns arise.  Patient Instructions   Will notify you  of labs when available.  This may be an early  version of  Alopecia aerata .   Wt Readings from Last 3 Encounters:  12/31/17 137 lb 6.4 oz (62.3 kg)  08/18/17 141 lb 9.6 oz (64.2 kg)  08/02/16 142 lb 8 oz (64.6 kg)   Advise next step would be   Seeing dermatologist .     Standley Brooking. Starsky Nanna M.D.

## 2018-01-02 LAB — ANA: Anti Nuclear Antibody(ANA): NEGATIVE

## 2018-01-02 LAB — THYROID PEROXIDASE ANTIBODY: Thyroperoxidase Ab SerPl-aCnc: 30 IU/mL — ABNORMAL HIGH (ref <9)

## 2018-01-05 DIAGNOSIS — L638 Other alopecia areata: Secondary | ICD-10-CM | POA: Diagnosis not present

## 2018-01-05 MED FILL — FLUOCINONIDE 0.05 % SOLN: 0.05 | 30 days supply | Qty: 60 | Fill #0

## 2018-01-05 NOTE — Telephone Encounter (Signed)
Please advise Dr Regis Bill, thanks.   Recent Results (from the past 2160 hour(s))  TSH     Status: None   Collection Time: 12/31/17 10:10 AM  Result Value Ref Range   TSH 1.32 0.35 - 4.50 uIU/mL  T4, free     Status: None   Collection Time: 12/31/17 10:10 AM  Result Value Ref Range   Free T4 1.02 0.60 - 1.60 ng/dL    Comment: Specimens from patients who are undergoing biotin therapy and /or ingesting biotin supplements may contain high levels of biotin.  The higher biotin concentration in these specimens interferes with this Free T4 assay.  Specimens that contain high levels  of biotin may cause false high results for this Free T4 assay.  Please interpret results in light of the total clinical presentation of the patient.    CBC with Differential/Platelet     Status: Abnormal   Collection Time: 12/31/17 10:10 AM  Result Value Ref Range   WBC 10.0 4.0 - 10.5 K/uL   RBC 4.25 4.22 - 5.81 Mil/uL   Hemoglobin 14.5 13.0 - 17.0 g/dL   HCT 42.0 39.0 - 52.0 %   MCV 99.0 78.0 - 100.0 fl   MCHC 34.5 30.0 - 36.0 g/dL   RDW 13.2 11.5 - 15.5 %   Platelets 302.0 150.0 - 400.0 K/uL   Neutrophils Relative % 74.0 43.0 - 77.0 %   Lymphocytes Relative 13.0 12.0 - 46.0 %   Monocytes Relative 11.8 3.0 - 12.0 %   Eosinophils Relative 0.3 0.0 - 5.0 %   Basophils Relative 0.9 0.0 - 3.0 %   Neutro Abs 7.4 1.4 - 7.7 K/uL   Lymphs Abs 1.3 0.7 - 4.0 K/uL   Monocytes Absolute 1.2 (H) 0.1 - 1.0 K/uL   Eosinophils Absolute 0.0 0.0 - 0.7 K/uL   Basophils Absolute 0.1 0.0 - 0.1 K/uL  ANA     Status: None   Collection Time: 12/31/17 10:10 AM  Result Value Ref Range   Anti Nuclear Antibody(ANA) NEGATIVE NEGATIVE    Comment: ANA IFA is a first line screen for detecting the presence of up to approximately 150 autoantibodies in various autoimmune diseases. A negative ANA IFA result suggests an ANA-associated autoimmune disease is not present at this time, but is not definitive. If there is high clinical suspicion  for Sjogren's syndrome, testing for anti-SS-A/Ro antibody should be considered. Anti-Jo-1 antibody should be considered for clinically suspected inflammatory myopathies. . AC-0: Negative . International Consensus on ANA Patterns (https://www.hernandez-brewer.com/) . For additional information, please refer to http://education.[facility].com/faq/FAQ177 (This link is being provided for informational/ educational purposes only.) .   Thyroid peroxidase antibody     Status: Abnormal   Collection Time: 12/31/17 10:10 AM  Result Value Ref Range   Thyroperoxidase Ab SerPl-aCnc 30 (H) <9 IU/mL

## 2018-01-07 MED FILL — SYNTHROID 25 MCG TABLET: 25 | 30 days supply | Qty: 45 | Fill #3

## 2018-02-03 MED FILL — SYNTHROID 25 MCG TABLET: 25 | 30 days supply | Qty: 45 | Fill #4

## 2018-03-02 MED FILL — SYNTHROID 25 MCG TABLET: 25 | 30 days supply | Qty: 45 | Fill #5

## 2018-03-13 MED FILL — valACYclovir HCL 1 GM TABS: 1 | 8 days supply | Qty: 30 | Fill #2

## 2018-04-02 MED FILL — SYNTHROID 25 MCG TABLET: 25 | 30 days supply | Qty: 45 | Fill #6

## 2018-05-06 MED FILL — SYNTHROID 25 MCG TABLET: 25 | 30 days supply | Qty: 45 | Fill #7

## 2018-05-20 ENCOUNTER — Other Ambulatory Visit: Payer: Self-pay

## 2018-05-20 MED ORDER — SYNTHROID 25 MCG PO TABS: ORAL_TABLET | ORAL | 0 refills | Status: DC

## 2018-05-20 NOTE — Telephone Encounter (Signed)
Ok to wait until fall  September CPX   please send in refill of his thyroid medication to get to then

## 2018-06-04 MED FILL — valACYclovir HCL 1 GM TABS: 1 | 8 days supply | Qty: 30 | Fill #3

## 2018-06-04 MED FILL — SYNTHROID 25 MCG TABLET: 25 | 30 days supply | Qty: 45 | Fill #8

## 2018-06-15 ENCOUNTER — Telehealth: Payer: Self-pay | Admitting: Internal Medicine

## 2018-06-15 NOTE — Telephone Encounter (Signed)
In red folder to fill out 

## 2018-06-15 NOTE — Telephone Encounter (Signed)
Roy Carr dropped of a physical form to be completed for the patient  Mail form with the attached envelope  Disposition: Dr's [facility]

## 2018-06-23 NOTE — Telephone Encounter (Signed)
Form completed on your desk 

## 2018-06-24 NOTE — Telephone Encounter (Signed)
Parent has been informed that form has been mailed out

## 2018-06-30 MED FILL — SYNTHROID 25 MCG TABLET: 25 | 30 days supply | Qty: 45 | Fill #0

## 2018-08-03 ENCOUNTER — Other Ambulatory Visit: Payer: Self-pay | Admitting: Internal Medicine

## 2018-08-03 MED ORDER — SYNTHROID 25 MCG PO TABS: ORAL_TABLET | ORAL | 0 refills | Status: DC

## 2018-08-03 MED FILL — SYNTHROID 25 MCG TABLET: 25 | 90 days supply | Qty: 135 | Fill #0

## 2018-08-03 NOTE — Telephone Encounter (Signed)
Requested Prescriptions  Pending Prescriptions Disp Refills  . SYNTHROID 25 MCG tablet 135 tablet 0    Sig: TAKE 1 AND 1/2 TABLETS (37.5 MCG TOTAL) BY MOUTH DAILY     Endocrinology:  Hypothyroid Agents Failed - 08/03/2018  9:24 AM      Failed - TSH needs to be rechecked within 3 months after an abnormal result. Refill until TSH is due.      Passed - TSH in normal range and within 360 days    TSH  Date Value Ref Range Status  12/31/2017 1.32 0.35 - 4.50 uIU/mL Final         Passed - Valid encounter within last 12 months    Recent Outpatient Visits          7 months ago [facility], MD   11 months ago Visit for preventive health examination   [facility], [facility] at [facility], Standley Brooking, MD   2 years ago Visit for preventive health examination   [facility], Standley Brooking, MD   3 years ago Visit for preventive health examination   [facility], Standley Brooking, MD   4 years ago Visit for preventive health examination   Therapist, [facility] at [facility], Standley Brooking, MD      Future Appointments            In 4 months Panosh, Standley Brooking, MD [facility] [place], [facility]

## 2018-08-03 NOTE — Telephone Encounter (Signed)
Requesting a three month supply of this medication.

## 2018-09-09 DIAGNOSIS — H25013 Cortical age-related cataract, bilateral: Secondary | ICD-10-CM | POA: Diagnosis not present

## 2018-09-24 ENCOUNTER — Telehealth: Payer: Self-pay | Admitting: Internal Medicine

## 2018-09-24 NOTE — Telephone Encounter (Signed)
Patient's mother wants to know since she is her guardian if patient needs to be there for his visit on 09/28/18.  Patient mom Roy Carr) needs a call back for verification.

## 2018-09-25 NOTE — Telephone Encounter (Signed)
Clinic RN spoke with mother of patient. Per mother patient is a poor historian. Advised mother that her and patient need to be present during visit. Mother verbalized understanding.

## 2018-09-28 ENCOUNTER — Other Ambulatory Visit: Payer: Self-pay

## 2018-09-28 ENCOUNTER — Ambulatory Visit (INDEPENDENT_AMBULATORY_CARE_PROVIDER_SITE_OTHER): Payer: 59 | Admitting: Internal Medicine

## 2018-09-28 ENCOUNTER — Encounter: Payer: Self-pay | Admitting: Internal Medicine

## 2018-09-28 DIAGNOSIS — E039 Hypothyroidism, unspecified: Secondary | ICD-10-CM

## 2018-09-28 DIAGNOSIS — Z79899 Other long term (current) drug therapy: Secondary | ICD-10-CM

## 2018-09-28 DIAGNOSIS — Q909 Down syndrome, unspecified: Secondary | ICD-10-CM | POA: Diagnosis not present

## 2018-09-28 DIAGNOSIS — R634 Abnormal weight loss: Secondary | ICD-10-CM | POA: Diagnosis not present

## 2018-09-28 NOTE — Progress Notes (Signed)
Virtual Visit via Video Note  I connected with@ on 09/28/18 at  2:00 PM EDT by a video enabled telemedicine application and verified that I am speaking with the correct person using two identifiers. Location patient: home Location provider:work  office Persons participating in the virtual visit: patient, provider  [place]th national recommendations  regarding COVID 19 pandemic   video visit is advised over in office visit for this patient.  Patient aware  of the limitations of evaluation and management by telemedicine and  availability of in person appointments. and agreed to proceed.   HPI: Roy Carr presents for video visit  Due for cpx but delayed cause of covid shut down   Virtual visit noted weight.  loss    Down to 123  Eating  Plant based some  And acts normal otherwise without fever sweats new gi sx  Vomiting urinary .  Mom says hand tremor at times the same   No falling or new neuro sx  . No bleeding   Sleep changes No pulm sx  Thyroid  Med the same for year 1.5 tabs and branded med  ROS: See pertinent positives and negatives per HPI.  Past Medical History:  Diagnosis Date  . Hypothyroid    prev per Dr Tobe Sos   . Personal history of spine surgery   . Spondylisthesis    grade 2 corrected  . Trisomy 21   . Varicose veins    intervention 5 2011    Past Surgical History:  Procedure Laterality Date  . ABLATION SAPHENOUS VEIN W/ RFA    . SPINE SURGERY    . WISDOM TOOTH EXTRACTION      Family History  Problem Relation Age of Onset  . Melanoma Father     Social History   Tobacco Use  . Smoking status: Never Smoker  . Smokeless tobacco: Never Used  Substance Use Topics  . Alcohol use: No  . Drug use: No      Current Outpatient Medications:  .  SYNTHROID 25 MCG tablet, TAKE 1 AND 1/2 TABLETS (37.5 MCG TOTAL) BY MOUTH DAILY, Disp: 135 tablet, Rfl: 0 .  valACYclovir (VALTREX) 1000 MG tablet, TAKE 2 TABLETS BY MOUTH TWICE DAILY FOR COLD SORES, Disp: 30 tablet,  Rfl: PRN  EXAM: BP Readings from Last 3 Encounters:  12/31/17 102/64  08/18/17 (!) 94/58  08/02/16 90/70    VITALS per patient if applicable:  GENERAL: alert, oriented, appears well and in no acute distress  HEENT: atraumatic, conjunttiva clear, no obvious abnormalities on inspection of external nose and ears  NECK: normal movements of the head and neck  LUNGS: on inspection no signs of respiratory distress, breathing rate appears normal, no obvious gross SOB, gasping or wheezing  CV: no obvious cyanosis  Alert  And normal affect  For  Him  No obv tremor Lab Results  Component Value Date   WBC 10.0 12/31/2017   HGB 14.5 12/31/2017   HCT 42.0 12/31/2017   PLT 302.0 12/31/2017   GLUCOSE 69 (L) 08/18/2017   CHOL 192 08/18/2017   TRIG 166.0 (H) 08/18/2017   HDL 29.90 (L) 08/18/2017   LDLDIRECT 160.4 07/03/2012   LDLCALC 128 (H) 08/18/2017   ALT 16 08/18/2017   AST 15 08/18/2017   NA 139 08/18/2017   K 4.6 08/18/2017   CL 103 08/18/2017   CREATININE 0.81 08/18/2017   BUN 12 08/18/2017   CO2 30 08/18/2017   TSH 1.32 12/31/2017   Wt Readings from Last  3 Encounters:  12/31/17 137 lb 6.4 oz (62.3 kg)  08/18/17 141 lb 9.6 oz (64.2 kg)  08/02/16 142 lb 8 oz (64.6 kg)   BP Readings from Last 3 Encounters:  12/31/17 102/64  08/18/17 (!) 94/58  08/02/16 90/70    ASSESSMENT AND PLAN:  Discussed the following assessment and plan:   ICD-10-CM   1. Weight loss  P38.2 Basic metabolic panel    CBC with Differential/Platelet    Hepatic function panel    Lipid panel    POCT Urinalysis Dipstick (Automated)    TSH    T4, free    T3, free    Thyroid antibodies    Thyroid stimulating immunoglobulin    Sedimentation rate    Celiac Disease Comprehensive Panel with Reflexes  2. Medication management  N05.397 Basic metabolic panel    CBC with Differential/Platelet    Hepatic function panel    Lipid panel    POCT Urinalysis Dipstick (Automated)    TSH    T4, free    T3,  free    Thyroid antibodies    Thyroid stimulating immunoglobulin    Sedimentation rate  3. Trisomy 21  Q73.4 Basic metabolic panel    CBC with Differential/Platelet    Hepatic function panel    Lipid panel    POCT Urinalysis Dipstick (Automated)    TSH    T4, free    T3, free    Thyroid antibodies    Thyroid stimulating immunoglobulin    Sedimentation rate    Celiac Disease Comprehensive Panel with Reflexes  4. Hypothyroidism, unspecified type  L93.7 Basic metabolic panel    CBC with Differential/Platelet    Hepatic function panel    Lipid panel    POCT Urinalysis Dipstick (Automated)    TSH    T4, free    T3, free    Thyroid antibodies    Thyroid stimulating immunoglobulin    Sedimentation rate  weight loss without other ws  Base line tremor intermittent   No new sx  plan labs and then FU  Exam etc     Mom doesn't  Any change on his exam based on  Hx  Full labs due  Then plan physical exam  Depending on results and plan   but hx seem long standing and not progressive   And mom reports is well otherwise  Counseled.   Expectant management and discussion of plan and treatment with opportunity to ask questions and all were answered. The patient agreed with the plan and demonstrated an understanding of the instructions.   Advised to call back or seek an in-person evaluation if worsening  or having  further concerns .  in the interim   Shanon Ace, MD

## 2018-10-01 ENCOUNTER — Other Ambulatory Visit (INDEPENDENT_AMBULATORY_CARE_PROVIDER_SITE_OTHER): Payer: 59

## 2018-10-01 ENCOUNTER — Other Ambulatory Visit: Payer: Self-pay

## 2018-10-01 DIAGNOSIS — Q909 Down syndrome, unspecified: Secondary | ICD-10-CM

## 2018-10-01 DIAGNOSIS — R634 Abnormal weight loss: Secondary | ICD-10-CM | POA: Diagnosis not present

## 2018-10-01 DIAGNOSIS — Z79899 Other long term (current) drug therapy: Secondary | ICD-10-CM

## 2018-10-01 DIAGNOSIS — E039 Hypothyroidism, unspecified: Secondary | ICD-10-CM

## 2018-10-01 LAB — HEPATIC FUNCTION PANEL
ALT: 16 U/L (ref 0–53)
AST: 15 U/L (ref 0–37)
Albumin: 4.2 g/dL (ref 3.5–5.2)
Alkaline Phosphatase: 77 U/L (ref 39–117)
Bilirubin, Direct: 0.2 mg/dL (ref 0.0–0.3)
Total Bilirubin: 1.5 mg/dL — ABNORMAL HIGH (ref 0.2–1.2)
Total Protein: 6.7 g/dL (ref 6.0–8.3)

## 2018-10-01 LAB — BASIC METABOLIC PANEL
BUN: 15 mg/dL (ref 6–23)
CO2: 29 mEq/L (ref 19–32)
Calcium: 9.6 mg/dL (ref 8.4–10.5)
Chloride: 102 mEq/L (ref 96–112)
Creatinine, Ser: 0.74 mg/dL (ref 0.40–1.50)
GFR: 123.27 mL/min (ref >60.00)
Glucose, Bld: 83 mg/dL (ref 70–99)
Potassium: 4.7 mEq/L (ref 3.5–5.1)
Sodium: 138 mEq/L (ref 135–145)

## 2018-10-01 LAB — CBC WITH DIFFERENTIAL/PLATELET
Basophils Absolute: 0.1 10*3/uL (ref 0.0–0.1)
Basophils Relative: 2.4 % (ref 0.0–3.0)
Eosinophils Absolute: 0 10*3/uL (ref 0.0–0.7)
Eosinophils Relative: 0.9 % (ref 0.0–5.0)
HCT: 43.7 % (ref 39.0–52.0)
Hemoglobin: 15 g/dL (ref 13.0–17.0)
Lymphocytes Relative: 34 % (ref 12.0–46.0)
Lymphs Abs: 1.3 10*3/uL (ref 0.7–4.0)
MCHC: 34.4 g/dL (ref 30.0–36.0)
MCV: 104.2 fl — ABNORMAL HIGH (ref 78.0–100.0)
Monocytes Absolute: 0.4 10*3/uL (ref 0.1–1.0)
Monocytes Relative: 9.8 % (ref 3.0–12.0)
Neutro Abs: 1.9 10*3/uL (ref 1.4–7.7)
Neutrophils Relative %: 52.9 % (ref 43.0–77.0)
Platelets: 214 10*3/uL (ref 150.0–400.0)
RBC: 4.2 Mil/uL — ABNORMAL LOW (ref 4.22–5.81)
RDW: 13.1 % (ref 11.5–15.5)
WBC: 3.7 10*3/uL — ABNORMAL LOW (ref 4.0–10.5)

## 2018-10-01 LAB — SEDIMENTATION RATE: Sed Rate: 17 mm/hr — ABNORMAL HIGH (ref 0–15)

## 2018-10-01 LAB — POC URINALSYSI DIPSTICK (AUTOMATED)
Bilirubin, UA: NEGATIVE
Blood, UA: NEGATIVE
Glucose, UA: NEGATIVE
Ketones, UA: NEGATIVE
Leukocytes, UA: NEGATIVE
Nitrite, UA: NEGATIVE
Protein, UA: NEGATIVE
Spec Grav, UA: 1.02 (ref 1.010–1.025)
Urobilinogen, UA: 0.2 E.U./dL
pH, UA: 6 (ref 5.0–8.0)

## 2018-10-01 LAB — LIPID PANEL
Cholesterol: 193 mg/dL (ref 0–200)
HDL: 38.4 mg/dL — ABNORMAL LOW (ref >39.00)
LDL Cholesterol: 128 mg/dL — ABNORMAL HIGH (ref 0–99)
NonHDL: 154.31
Total CHOL/HDL Ratio: 5
Triglycerides: 130 mg/dL (ref 0.0–149.0)
VLDL: 26 mg/dL (ref 0.0–40.0)

## 2018-10-01 LAB — T4, FREE: Free T4: 0.86 ng/dL (ref 0.60–1.60)

## 2018-10-01 LAB — T3, FREE: T3, Free: 3 pg/mL (ref 2.3–4.2)

## 2018-10-01 LAB — TSH: TSH: 3.09 u[IU]/mL (ref 0.35–4.50)

## 2018-10-06 DIAGNOSIS — R748 Abnormal levels of other serum enzymes: Secondary | ICD-10-CM

## 2018-10-06 DIAGNOSIS — R768 Other specified abnormal immunological findings in serum: Secondary | ICD-10-CM

## 2018-10-06 LAB — THYROID ANTIBODIES
Thyroglobulin Ab: <1 IU/mL (ref <=1)
Thyroperoxidase Ab SerPl-aCnc: 59 IU/mL — ABNORMAL HIGH (ref <9)

## 2018-10-06 LAB — CELIAC DISEASE COMPREHENSIVE PANEL WITH REFLEXES
(tTG) Ab, IgA: 2 U/mL
Immunoglobulin A: 520 mg/dL — ABNORMAL HIGH (ref 47–310)

## 2018-10-06 LAB — THYROID STIMULATING IMMUNOGLOBULIN: TSI: <89 % baseline (ref <140)

## 2018-10-07 NOTE — Telephone Encounter (Signed)
I sent this to her and placed orders   So  An elevated   IGA can be seen in inflammatory conditions  Such as  Lupus and RA  Blood diseases  etc  Or with  With  Mucosal surfaces I inflammation    . The celiac antibody is negative   Although certainly  He has higher risk of this disease,the results make it much less likely to be celiac   Elevated   IG A  alone is not consistent with a dx of celiac . However  I  agree we should get more information  and evaluation   . I would like to do further blood testing  spep  IFE and some other inflammatory markers   And we can refer to Gastreenterologist of your choice  about the weight loss.   I will place orders and have [facility] arrange blood work appt,  Let us know  If you have preference if you want to proceed with specialist consult.  Thanks [facility]

## 2018-10-23 DIAGNOSIS — R634 Abnormal weight loss: Secondary | ICD-10-CM | POA: Diagnosis not present

## 2018-10-23 DIAGNOSIS — R799 Abnormal finding of blood chemistry, unspecified: Secondary | ICD-10-CM | POA: Diagnosis not present

## 2018-10-23 DIAGNOSIS — D531 Other megaloblastic anemias, not elsewhere classified: Secondary | ICD-10-CM | POA: Diagnosis not present

## 2018-10-26 DIAGNOSIS — R799 Abnormal finding of blood chemistry, unspecified: Secondary | ICD-10-CM | POA: Diagnosis not present

## 2018-10-26 DIAGNOSIS — D531 Other megaloblastic anemias, not elsewhere classified: Secondary | ICD-10-CM | POA: Diagnosis not present

## 2018-10-26 DIAGNOSIS — R634 Abnormal weight loss: Secondary | ICD-10-CM | POA: Diagnosis not present

## 2018-10-26 LAB — CBC AND DIFFERENTIAL: HCT: 42 (ref 41–53)

## 2018-10-26 LAB — VITAMIN B12: Vitamin B-12: 330

## 2018-10-29 ENCOUNTER — Other Ambulatory Visit: Payer: Self-pay | Admitting: Gastroenterology

## 2018-10-29 ENCOUNTER — Other Ambulatory Visit: Payer: Self-pay | Admitting: Internal Medicine

## 2018-10-29 DIAGNOSIS — R634 Abnormal weight loss: Secondary | ICD-10-CM

## 2018-10-29 DIAGNOSIS — R799 Abnormal finding of blood chemistry, unspecified: Secondary | ICD-10-CM

## 2018-10-30 MED FILL — SYNTHROID 25 MCG TABLET: 25 | 90 days supply | Qty: 135 | Fill #0

## 2018-11-06 ENCOUNTER — Ambulatory Visit
Admission: RE | Admit: 2018-11-06 | Discharge: 2018-11-06 | Disposition: A | Discharge status: Home / Self Care | Payer: 59 | Source: Ambulatory Visit | Attending: Gastroenterology | Admitting: Gastroenterology

## 2018-11-06 ENCOUNTER — Other Ambulatory Visit: Payer: Self-pay

## 2018-11-06 DIAGNOSIS — R799 Abnormal finding of blood chemistry, unspecified: Secondary | ICD-10-CM

## 2018-11-06 DIAGNOSIS — R634 Abnormal weight loss: Secondary | ICD-10-CM

## 2018-11-06 DIAGNOSIS — R768 Other specified abnormal immunological findings in serum: Secondary | ICD-10-CM | POA: Diagnosis not present

## 2018-11-06 MED ORDER — IOPAMIDOL (ISOVUE-300) INJECTION 61%
100.0000 mL | Freq: Once | INTRAVENOUS | Status: AC | PRN
  Administered 2018-11-06: 100 mL via INTRAVENOUS

## 2018-11-11 DIAGNOSIS — R634 Abnormal weight loss: Secondary | ICD-10-CM

## 2018-11-11 DIAGNOSIS — R718 Other abnormality of red blood cells: Secondary | ICD-10-CM

## 2018-11-11 DIAGNOSIS — Q909 Down syndrome, unspecified: Secondary | ICD-10-CM

## 2018-11-11 DIAGNOSIS — E538 Deficiency of other specified B group vitamins: Secondary | ICD-10-CM

## 2018-11-11 NOTE — Telephone Encounter (Signed)
So some of the tests I ordered  Dr Watt Climes already did .   There is no evidence of celiac from the lab work  And at this time wouldn't pursue procedures  Esp since GI not  recommending and he has no other compelling reason to do ENDO  At this time   Glad the ct is normal  . Reassuring .   I suggest these labs be done before the appt at the end of the month    If we are still concerned then I would consider a hematology  Consult  .

## 2018-11-12 ENCOUNTER — Encounter: Payer: Self-pay | Admitting: Internal Medicine

## 2018-11-17 NOTE — Telephone Encounter (Signed)
So ok to arrange  labs before  Next visit     [place]  I am going to add an order since the B12 was low normal  And  Please make sure ALL  future labs are completed  At that lab appt

## 2018-11-17 NOTE — Telephone Encounter (Signed)
I think already answered   Hold on any procedure for now and lab and fu as planned

## 2018-11-30 ENCOUNTER — Other Ambulatory Visit (INDEPENDENT_AMBULATORY_CARE_PROVIDER_SITE_OTHER): Payer: 59

## 2018-11-30 ENCOUNTER — Other Ambulatory Visit: Payer: Self-pay

## 2018-11-30 DIAGNOSIS — R748 Abnormal levels of other serum enzymes: Secondary | ICD-10-CM

## 2018-11-30 DIAGNOSIS — E538 Deficiency of other specified B group vitamins: Secondary | ICD-10-CM

## 2018-11-30 DIAGNOSIS — R718 Other abnormality of red blood cells: Secondary | ICD-10-CM

## 2018-11-30 DIAGNOSIS — R768 Other specified abnormal immunological findings in serum: Secondary | ICD-10-CM

## 2018-11-30 DIAGNOSIS — R634 Abnormal weight loss: Secondary | ICD-10-CM | POA: Diagnosis not present

## 2018-11-30 DIAGNOSIS — Q909 Down syndrome, unspecified: Secondary | ICD-10-CM | POA: Diagnosis not present

## 2018-11-30 LAB — POC URINALSYSI DIPSTICK (AUTOMATED)
Bilirubin, UA: NEGATIVE
Blood, UA: NEGATIVE
Glucose, UA: NEGATIVE
Ketones, UA: NEGATIVE
Leukocytes, UA: NEGATIVE
Nitrite, UA: NEGATIVE
Protein, UA: NEGATIVE
Spec Grav, UA: >=1.03 — AB (ref 1.010–1.025)
Urobilinogen, UA: 0.2 E.U./dL
pH, UA: 6 (ref 5.0–8.0)

## 2018-11-30 LAB — FOLATE: Folate: 24.4 ng/mL (ref >5.9)

## 2018-11-30 LAB — C-REACTIVE PROTEIN: CRP: <1 mg/dL (ref 0.5–20.0)

## 2018-12-02 LAB — PROTEIN ELECTROPHORESIS, SERUM
Albumin ELP: 3.8 g/dL (ref 3.8–4.8)
Alpha 1: 0.3 g/dL (ref 0.2–0.3)
Alpha 2: 0.8 g/dL (ref 0.5–0.9)
Beta 2: 0.5 g/dL (ref 0.2–0.5)
Beta Globulin: 0.4 g/dL (ref 0.4–0.6)
Gamma Globulin: 1.2 g/dL (ref 0.8–1.7)
Total Protein: 6.9 g/dL (ref 6.1–8.1)

## 2018-12-02 LAB — IMMUNOFIXATION ELECTROPHORESIS
IgG (Immunoglobin G), Serum: 1148 mg/dL (ref 600–1640)
IgM, Serum: 30 mg/dL — ABNORMAL LOW (ref 50–300)
Immunofix Electr Int: NOT DETECTED
Immunoglobulin A: 543 mg/dL — ABNORMAL HIGH (ref 47–310)

## 2018-12-02 LAB — METHYLMALONIC ACID, SERUM: Methylmalonic Acid, Quant: 114 nmol/L (ref 87–318)

## 2018-12-11 ENCOUNTER — Ambulatory Visit (INDEPENDENT_AMBULATORY_CARE_PROVIDER_SITE_OTHER): Payer: 59 | Admitting: Internal Medicine

## 2018-12-11 ENCOUNTER — Ambulatory Visit (HOSPITAL_COMMUNITY)
Admission: RE | Admit: 2018-12-11 | Discharge: 2018-12-11 | Disposition: A | Discharge status: Home / Self Care | Payer: 59 | Source: Ambulatory Visit | Attending: Internal Medicine | Admitting: Internal Medicine

## 2018-12-11 ENCOUNTER — Other Ambulatory Visit: Payer: Self-pay

## 2018-12-11 ENCOUNTER — Encounter: Payer: Self-pay | Admitting: Internal Medicine

## 2018-12-11 VITALS — BP 90/70 | HR 68 | Temp 98.0°F | Ht 64.0 in | Wt 120.0 lb

## 2018-12-11 DIAGNOSIS — Q909 Down syndrome, unspecified: Secondary | ICD-10-CM | POA: Insufficient documentation

## 2018-12-11 DIAGNOSIS — Z23 Encounter for immunization: Secondary | ICD-10-CM

## 2018-12-11 DIAGNOSIS — R748 Abnormal levels of other serum enzymes: Secondary | ICD-10-CM

## 2018-12-11 DIAGNOSIS — R718 Other abnormality of red blood cells: Secondary | ICD-10-CM

## 2018-12-11 DIAGNOSIS — R768 Other specified abnormal immunological findings in serum: Secondary | ICD-10-CM

## 2018-12-11 DIAGNOSIS — Z0001 Encounter for general adult medical examination with abnormal findings: Secondary | ICD-10-CM | POA: Diagnosis not present

## 2018-12-11 DIAGNOSIS — R634 Abnormal weight loss: Secondary | ICD-10-CM

## 2018-12-11 DIAGNOSIS — Z0000 Encounter for general adult medical examination without abnormal findings: Secondary | ICD-10-CM

## 2018-12-11 NOTE — Patient Instructions (Addendum)
Health Maintenance Due  Topic Date Due  . HIV Screening  08/20/2002  . TETANUS/TDAP  03/03/2017  . INFLUENZA VACCINE  11/14/2018    Get chest x ray when convenient  At [facility]  Referral to hematology for consult with Dr Benay Spice for opinion on the  elevated Iga   Weight loss etc.      Health Maintenance, Male Adopting a healthy lifestyle and getting preventive care are important in promoting health and wellness. Ask your health care provider about:  The right schedule for you to have regular tests and exams.  Things you can do on your own to prevent diseases and keep yourself healthy. What should I know about diet, weight, and exercise? Eat a healthy diet   Eat a diet that includes plenty of vegetables, fruits, low-fat dairy products, and lean protein.  Do not eat a lot of foods that are high in solid fats, added sugars, or sodium. Maintain a healthy weight Body mass index (BMI) is a measurement that can be used to identify possible weight problems. It estimates body fat based on height and weight. Your health care provider can help determine your BMI and help you achieve or maintain a healthy weight. Get regular exercise Get regular exercise. This is one of the most important things you can do for your health. Most adults should:  Exercise for at least 150 minutes each week. The exercise should increase your heart rate and make you sweat (moderate-intensity exercise).  Do strengthening exercises at least twice a week. This is in addition to the moderate-intensity exercise.  Spend less time sitting. Even light physical activity can be beneficial. Watch cholesterol and blood lipids Have your blood tested for lipids and cholesterol at 31 years of age, then have this test every 5 years. You may need to have your cholesterol levels checked more often if:  Your lipid or cholesterol levels are high.  You are older than 31 years of age.  You are at high risk for heart  disease. What should I know about cancer screening? Many types of cancers can be detected early and may often be prevented. Depending on your health history and family history, you may need to have cancer screening at various ages. This may include screening for:  Colorectal cancer.  Prostate cancer.  Skin cancer.  Lung cancer. What should I know about heart disease, diabetes, and high blood pressure? Blood pressure and heart disease  High blood pressure causes heart disease and increases the risk of stroke. This is more likely to develop in people who have high blood pressure readings, are of African descent, or are overweight.  Talk with your health care provider about your target blood pressure readings.  Have your blood pressure checked: ? Every 3-5 years if you are 59-27 years of age. ? Every year if you are 43 years old or older.  If you are between the ages of 65 and 58 and are a current or former smoker, ask your health care provider if you should have a one-time screening for abdominal aortic aneurysm (AAA). Diabetes Have regular diabetes screenings. This checks your fasting blood sugar level. Have the screening done:  Once every three years after age 16 if you are at a normal weight and have a low risk for diabetes.  More often and at a younger age if you are overweight or have a high risk for diabetes. What should I know about preventing infection? Hepatitis B If you have a higher  risk for hepatitis B, you should be screened for this virus. Talk with your health care provider to find out if you are at risk for hepatitis B infection. Hepatitis C Blood testing is recommended for:  Everyone born from [birth year] through 1965.  Anyone with known risk factors for hepatitis C. Sexually transmitted infections (STIs)  You should be screened each year for STIs, including gonorrhea and chlamydia, if: ? You are sexually active and are younger than 31 years of age. ? You are older  than 31 years of age and your health care provider tells you that you are at risk for this type of infection. ? Your sexual activity has changed since you were last screened, and you are at increased risk for chlamydia or gonorrhea. Ask your health care provider if you are at risk.  Ask your health care provider about whether you are at high risk for HIV. Your health care provider may recommend a prescription medicine to help prevent HIV infection. If you choose to take medicine to prevent HIV, you should first get tested for HIV. You should then be tested every 3 months for as long as you are taking the medicine. Follow these instructions at home: Lifestyle  Do not use any products that contain nicotine or tobacco, such as cigarettes, e-cigarettes, and chewing tobacco. If you need help quitting, ask your health care provider.  Do not use street drugs.  Do not share needles.  Ask your health care provider for help if you need support or information about quitting drugs. Alcohol use  Do not drink alcohol if your health care provider tells you not to drink.  If you drink alcohol: ? Limit how much you have to 0-2 drinks a day. ? Be aware of how much alcohol is in your drink. In the [place] one drink equals one 12 oz bottle of beer (355 mL), one 5 oz glass of wine (148 mL), or one 1 oz glass of hard liquor (44 mL). General instructions  Schedule regular health, dental, and eye exams.  Stay current with your vaccines.  Tell your health care provider if: ? You often feel depressed. ? You have ever been abused or do not feel safe at home. Summary  Adopting a healthy lifestyle and getting preventive care are important in promoting health and wellness.  Follow your health care provider's instructions about healthy diet, exercising, and getting tested or screened for diseases.  Follow your health care provider's instructions on monitoring your cholesterol and blood pressure. This  information is not intended to replace advice given to you by your health care provider. Make sure you discuss any questions you have with your health care provider. Document Released: 09/28/2007 Document Revised: 03/25/2018 Document Reviewed: 03/25/2018 Elsevier Patient Education  2020 [employer].

## 2018-12-11 NOTE — Progress Notes (Signed)
Chief Complaint  Patient presents with  . Annual Exam    Pt present for annual exam  . Weight Loss    Pt gaurdian wants to go over lab work     HPI: Patient  Roy Carr  33 y.o. comes in today for Preventive Health Care visit with mom today. Since last visit doing fine but still has weight loss for no obv reason eating fine but( healthy not junk food)   See prev eval  By GI neg abd ct scan    Celiac  Panel neg x elevated IGA  Persistent  .   Health Maintenance  Topic Date Due  . HIV Screening  08/20/2002  . INFLUENZA VACCINE  12/25/2018 (Originally 11/14/2018)  . TETANUS/TDAP  12/11/2019 (Originally 03/03/2017)   Health Maintenance Review LIFESTYLE:  Exercise:  Active less since covid  Not abel to do swimming Tobacco/ETS:n Alcohol: n Sugar beverages:n Sleep:ok  Drug use: no HH of 2 Work: 9[employer]3 [employer]  [place]   ROS:  GEN/ HEENT: No fever,  sweats headaches vision problems hearing changes, CV/ PULM; No chest pain shortness of breath cough, syncope,edema  change in exercise tolerance. GI /GU: No adominal pain, vomiting, change in bowel habits. No blood in the stool. No significant GU symptoms. SKIN/HEME: ,no acute skin rashes suspicious lesions or bleeding. No lymphadenopathy, nodules, masses.  NEURO/ PSYCH:  No neurologic signs such as weakness IMM/ Allergy: No unusual infections.  Allergy .   REST of 12 system review negative except as per HPI   Past Medical History:  Diagnosis Date  . Hypothyroid    prev per Dr Tobe Sos   . Personal history of spine surgery   . Spondylisthesis    grade 2 corrected  . Trisomy 21   . Varicose veins    intervention 5 2011    Past Surgical History:  Procedure Laterality Date  . ABLATION SAPHENOUS VEIN W/ RFA    . SPINE SURGERY    . WISDOM TOOTH EXTRACTION      Family History  Problem Relation Age of Onset  . Melanoma Father     Social History   Socioeconomic History  . Marital status: Single    Spouse  name: Not on file  . Number of children: Not on file  . Years of education: Not on file  . Highest education level: Not on file  Occupational History  . Not on file  Social Needs  . Financial resource strain: Not on file  . Food insecurity    Worry: Not on file    Inability: Not on file  . Transportation needs    Medical: Not on file    Non-medical: Not on file  Tobacco Use  . Smoking status: Never Smoker  . Smokeless tobacco: Never Used  Substance and Sexual Activity  . Alcohol use: No  . Drug use: No  . Sexual activity: Not on file  Lifestyle  . Physical activity    Days per week: Not on file    Minutes per session: Not on file  . Stress: Not on file  Relationships  . Social Herbalist on phone: Not on file    Gets together: Not on file    Attends religious service: Not on file    Active member of club or organization: Not on file    Attends meetings of clubs or organizations: Not on file    Relationship status: Not on file  Other Topics  Concern  . 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 SYNTHROID 25 MCG tablet TAKE 1 AND 1/2 TABLETS (37.5 MCG TOTAL) BY MOUTH DAILY 135 tablet 0  . valACYclovir (VALTREX) 1000 MG tablet TAKE 2 TABLETS BY MOUTH TWICE DAILY FOR COLD SORES 30 tablet PRN   No facility-administered medications prior to visit.      EXAM:  BP 90/70   Pulse 68   Temp 98 F (36.7 C) (Other (Comment))   Ht 5\' 4"  (1.626 m)   Wt 120 lb (54.4 kg)   SpO2 98%   BMI 20.60 kg/m   Body mass index is 20.6 kg/m. Wt Readings from Last 3 Encounters:  12/11/18 120 lb (54.4 kg)  12/31/17 137 lb 6.4 oz (62.3 kg)   08/18/17 141 lb 9.6 oz (64.2 kg)    Physical Exam: Vital signs reviewed [id number] is a well-developed well-nourished alert cooperative    who appearsr stated age in no acute distress.   Downs syndrome  verbal but interactive  HEENT: normocephalic atraumatic , Eyes: PERRL EOM's full, conjunctiva clear,, Ears: no deformity EAC's clear TMs with normal landmarks. Mouth: deferred NECK: supple without masses, thyromegaly or bruits. CHEST/PULM:  Clear to auscultation and percussion breath sounds equal no wheeze , rales or rhonchi. No chest wall deformities or tenderness. CV: PMI is nondisplaced, S1 S2 no gallops, murmurs, rubs. Peripheral pulses.No JVD .  ABDOMEN: Bowel sounds normal nontender  No guard or rebound, no hepato splenomegal no CVA tenderness.   Extremtities:  No clubbing cyanosis or edema, no acute joint swelling or redness no focal atrophy  Downs  Habitus  NEURO:  cranial nerves 3-12 appear to be intact, no obvious focal weakness,gait within normal limitsSKIN: No acute rashes normal turgor, color, no bruising or petechiae. Dry skin fair skin  LN: no cervical axillary inguinal adenopathy  Lab Results  Component Value Date   WBC 3.7 (L) 10/01/2018   HGB 15.0 10/01/2018   HCT 42 10/26/2018   PLT 214.0 10/01/2018   GLUCOSE 83 10/01/2018   CHOL 193 10/01/2018   TRIG 130.0 10/01/2018   HDL 38.40 (L) 10/01/2018   LDLDIRECT 160.4 07/03/2012   LDLCALC 128 (H) 10/01/2018   ALT 16 10/01/2018   AST 15 10/01/2018   NA 138 10/01/2018   K 4.7 10/01/2018   CL 102 10/01/2018   CREATININE 0.74 10/01/2018   BUN 15 10/01/2018   CO2 29 10/01/2018   TSH 3.09 10/01/2018    BP Readings from Last 3 Encounters:  12/11/18 90/70  12/31/17 102/64  08/18/17 (!) 94/58   Wt Readings from Last 3 Encounters:  12/11/18 120 lb (54.4 kg)  12/31/17 137 lb 6.4 oz (62.3 kg)  08/18/17 141 lb 9.6 oz (64.2 kg)    Lab results reviewed with mom    ASSESSMENT AND PLAN:  Discussed the following  assessment and plan:    ICD-10-CM   1. Visit for preventive health examination  Z00.00   2. Weight loss  R63.4 DG Chest 2 View  Ambulatory referral to Hematology   uncertain cause looks well  3. High total serum IgA  R74.8 Ambulatory referral to Hematology  4. Elevated MCV  R71.8 Ambulatory referral to Hematology  5. Trisomy 21  Q90.9 DG Chest 2 View    Ambulatory referral to Hematology   Looks well but his weight loss has been  On going  Disc next step  In regard to mcv iga and wegiht loss    Get c xray  R/o   Flu vaccine and td today  Patient Care Team: Panosh, Standley Brooking, MD as PCP - General Calvert Cantor, MD as Attending Physician (Ophthalmology) Danella Sensing, MD as Consulting Physician (Dermatology) Patient Instructions   Health Maintenance Due  Topic Date Due  . HIV Screening  08/20/2002  . TETANUS/TDAP  03/03/2017  . INFLUENZA VACCINE  11/14/2018    Get chest x ray when convenient  At [facility]  Referral to hematology for consult with Dr Benay Spice for opinion on the  elevated Iga   Weight loss etc.      Health Maintenance, Male Adopting a healthy lifestyle and getting preventive care are important in promoting health and wellness. Ask your health care provider about:  The right schedule for you to have regular tests and exams.  Things you can do on your own to prevent diseases and keep yourself healthy. What should I know about diet, weight, and exercise? Eat a healthy diet   Eat a diet that includes plenty of vegetables, fruits, low-fat dairy products, and lean protein.  Do not eat a lot of foods that are high in solid fats, added sugars, or sodium. Maintain a healthy weight Body mass index (BMI) is a measurement that can be used to identify possible weight problems. It estimates body fat based on height and weight. Your health care provider can help determine your BMI and help you achieve or maintain a healthy weight. Get regular exercise Get regular exercise.  This is one of the most important things you can do for your health. Most adults should:  Exercise for at least 150 minutes each week. The exercise should increase your heart rate and make you sweat (moderate-intensity exercise).  Do strengthening exercises at least twice a week. This is in addition to the moderate-intensity exercise.  Spend less time sitting. Even light physical activity can be beneficial. Watch cholesterol and blood lipids Have your blood tested for lipids and cholesterol at 31 years of age, then have this test every 5 years. You may need to have your cholesterol levels checked more often if:  Your lipid or cholesterol levels are high.  You are older than 31 years of age.  You are at high risk for heart disease. What should I know about cancer screening? Many types of cancers can be detected early and may often be prevented. Depending on your health history and family history, you may need to have cancer screening at various ages. This may include screening for:  Colorectal cancer.  Prostate cancer.  Skin cancer.  Lung cancer. What should I know about heart disease, diabetes, and high blood pressure? Blood pressure and heart disease  High blood pressure causes heart disease and increases the risk of stroke. This is more likely to develop in people who have high blood pressure readings, are of African descent, or are overweight.  Talk with your health care provider about your target blood pressure readings.  Have your blood pressure checked: ? Every 3-5 years if you are 18-39  years of age. ? Every year if you are 36 years old or older.  If you are between the ages of 66 and 22 and are a current or former smoker, ask your health care provider if you should have a one-time screening for abdominal aortic aneurysm (AAA). Diabetes Have regular diabetes screenings. This checks your fasting blood sugar level. Have the screening done:  Once every three years after  age 63 if you are at a normal weight and have a low risk for diabetes.  More often and at a younger age if you are overweight or have a high risk for diabetes. What should I know about preventing infection? Hepatitis B If you have a higher risk for hepatitis B, you should be screened for this virus. Talk with your health care provider to find out if you are at risk for hepatitis B infection. Hepatitis C Blood testing is recommended for:  Everyone born from [birth year] through 1963.  Anyone with known risk factors for hepatitis C. Sexually transmitted infections (STIs)  You should be screened each year for STIs, including gonorrhea and chlamydia, if: ? You are sexually active and are younger than 31 years of age. ? You are older than 31 years of age and your health care provider tells you that you are at risk for this type of infection. ? Your sexual activity has changed since you were last screened, and you are at increased risk for chlamydia or gonorrhea. Ask your health care provider if you are at risk.  Ask your health care provider about whether you are at high risk for HIV. Your health care provider may recommend a prescription medicine to help prevent HIV infection. If you choose to take medicine to prevent HIV, you should first get tested for HIV. You should then be tested every 3 months for as long as you are taking the medicine. Follow these instructions at home: Lifestyle  Do not use any products that contain nicotine or tobacco, such as cigarettes, e-cigarettes, and chewing tobacco. If you need help quitting, ask your health care provider.  Do not use street drugs.  Do not share needles.  Ask your health care provider for help if you need support or information about quitting drugs. Alcohol use  Do not drink alcohol if your health care provider tells you not to drink.  If you drink alcohol: ? Limit how much you have to 0-2 drinks a day. ? Be aware of how much alcohol is in  your drink. In the [place] one drink equals one 12 oz bottle of beer (355 mL), one 5 oz glass of wine (148 mL), or one 1 oz glass of hard liquor (44 mL). General instructions  Schedule regular health, dental, and eye exams.  Stay current with your vaccines.  Tell your health care provider if: ? You often feel depressed. ? You have ever been abused or do not feel safe at home. Summary  Adopting a healthy lifestyle and getting preventive care are important in promoting health and wellness.  Follow your health care provider's instructions about healthy diet, exercising, and getting tested or screened for diseases.  Follow your health care provider's instructions on monitoring your cholesterol and blood pressure. This information is not intended to replace advice given to you by your health care provider. Make sure you discuss any questions you have with your health care provider. Document Released: 09/28/2007 Document Revised: 03/25/2018 Document Reviewed: 03/25/2018 Elsevier Patient Education  2020 [employer].  Standley Brooking. Panosh M.D.

## 2018-12-11 NOTE — Addendum Note (Signed)
Addended by: Gwenyth Ober R on: 12/11/2018 03:49 PM   Modules accepted: Orders

## 2018-12-15 ENCOUNTER — Other Ambulatory Visit: Payer: Self-pay

## 2018-12-17 ENCOUNTER — Telehealth: Payer: Self-pay | Admitting: Oncology

## 2018-12-17 NOTE — Telephone Encounter (Signed)
Received a new hem referral from Roy Carr for elevated MCV and wt loss. Roy Carr has been scheduled to see Roy Carr on 9/24 at 2pm. Appt date and time has been given to the pt's mom.  Pt will need his mom at the appt because he has Down's Syndrome. I sent a msg to Roy Carr for approval. Roy Carr has been made aware to arrive 15 minutes early.

## 2018-12-28 ENCOUNTER — Encounter: Payer: 59 | Admitting: Internal Medicine

## 2019-01-07 ENCOUNTER — Inpatient Hospital Stay: Payer: 59

## 2019-01-07 ENCOUNTER — Inpatient Hospital Stay: Payer: 59 | Attending: Oncology | Admitting: Oncology

## 2019-01-07 ENCOUNTER — Other Ambulatory Visit: Payer: Self-pay

## 2019-01-07 ENCOUNTER — Other Ambulatory Visit: Payer: Self-pay | Admitting: *Deleted

## 2019-01-07 VITALS — BP 109/66 | HR 58 | Temp 98.7°F | Resp 17 | Ht 64.0 in | Wt 122.9 lb

## 2019-01-07 DIAGNOSIS — E039 Hypothyroidism, unspecified: Secondary | ICD-10-CM | POA: Insufficient documentation

## 2019-01-07 DIAGNOSIS — R718 Other abnormality of red blood cells: Secondary | ICD-10-CM

## 2019-01-07 DIAGNOSIS — Q909 Down syndrome, unspecified: Secondary | ICD-10-CM | POA: Insufficient documentation

## 2019-01-07 DIAGNOSIS — D469 Myelodysplastic syndrome, unspecified: Secondary | ICD-10-CM | POA: Insufficient documentation

## 2019-01-07 LAB — CBC WITH DIFFERENTIAL (CANCER CENTER ONLY)
Abs Immature Granulocytes: 0.01 10*3/uL (ref 0.00–0.07)
Basophils Absolute: 0.1 10*3/uL (ref 0.0–0.1)
Basophils Relative: 1 %
Eosinophils Absolute: 0 10*3/uL (ref 0.0–0.5)
Eosinophils Relative: 0 %
HCT: 41.3 % (ref 39.0–52.0)
Hemoglobin: 14.2 g/dL (ref 13.0–17.0)
Immature Granulocytes: 0 %
Lymphocytes Relative: 23 %
Lymphs Abs: 1.6 10*3/uL (ref 0.7–4.0)
MCH: 34.9 pg — ABNORMAL HIGH (ref 26.0–34.0)
MCHC: 34.4 g/dL (ref 30.0–36.0)
MCV: 101.5 fL — ABNORMAL HIGH (ref 80.0–100.0)
Monocytes Absolute: 0.5 10*3/uL (ref 0.1–1.0)
Monocytes Relative: 7 %
Neutro Abs: 4.6 10*3/uL (ref 1.7–7.7)
Neutrophils Relative %: 69 %
Platelet Count: 225 10*3/uL (ref 150–400)
RBC: 4.07 MIL/uL — ABNORMAL LOW (ref 4.22–5.81)
RDW: 12.1 % (ref 11.5–15.5)
WBC Count: 6.8 10*3/uL (ref 4.0–10.5)
nRBC: 0 % (ref 0.0–0.2)

## 2019-01-07 LAB — SAVE SMEAR(SSMR), FOR PROVIDER SLIDE REVIEW

## 2019-01-07 LAB — RETICULOCYTES
Immature Retic Fract: 6.6 % (ref 2.3–15.9)
RBC.: 4.05 MIL/uL — ABNORMAL LOW (ref 4.22–5.81)
Retic Count, Absolute: 45.8 10*3/uL (ref 19.0–186.0)
Retic Ct Pct: 1.1 % (ref 0.4–3.1)

## 2019-01-07 NOTE — Progress Notes (Signed)
[facility] New Patient Consult   Requesting MD: Burnis Medin, [place],  [place] [id number]   Doreatha Lew 33 y.o.  [birth year]    Reason for Consult: Elevated MCV, elevated IgA level   HPI: Mr. Stukes is referred for hematologic evaluation of red cell macrocytosis.  He is followed by Dr. Regis Bill for internal medicine care.  A CBC on 10/01/2018 found a white count of 317, hemoglobin 15, MCV 104 platelets 214,000 absolute neutrophil count 1.9. Additional diagnostic evaluation included a B12 level of 330 on 10/26/2018,.  A serum immunofixation electrophoresis on 11/30/2018, no monoclonal protein.  The IgA level was rated at 543 with an IgM of 30. Review of the electronic record indicates an elevated MCV dating back at least until 2011.    Mr.Schriver has Down syndrome.  He is here today with his mother.  He has no complaint.  She reports he feels well.  She is concerned he has lost weight over the past few years, but his weight has recently stabilized.  They have changed to a plant-based diet.  His mother reports he is undergoing negative evaluation for celiac sprue by Dr. Watt Climes. He had a negative chest x-ray and CT abdomen/pelvis this summer  Past Medical History:  Diagnosis Date  . Hypothyroid    prev per Dr Tobe Sos   . Personal history of spine surgery   . Spondylisthesis    grade 2 corrected  . Trisomy 21   . Varicose veins    intervention 5 2011    .  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 ABLATION SAPHENOUS VEIN W/ RFA    . SPINE SURGERY    . WISDOM TOOTH EXTRACTION      Medications: Reviewed  Allergies: No Known Allergies  Family history: His paternal grandfather had melanoma.  Social History:   He lives with his mother in [place].  He works in a Physicist, medical.  He does not use alcohol.  No risk factor for hepatitis.  ROS:   Positives include: 20 pound weight  loss over the past 2 years  A complete ROS was otherwise negative.  Physical Exam:  Blood pressure 109/66, pulse (!) 58, temperature 98.7 F (37.1 C), temperature source Oral, resp. rate 17, height 5\' 4"  (1.626 m), weight 122 lb 14.4 oz (55.7 kg), SpO2 99 %.  HEENT: Oral cavity without visible mass, neck without mass Lungs: Clear bilaterally Cardiac: Regular rate and rhythm Abdomen: No hepatosplenomegaly, no mass, nontender GU: Testes without mass Vascular: No leg edema Lymph nodes: No cervical, supraclavicular Neurologic: Alert, follows commands, motor exam appears intact in the upper and lower extremities bilaterally Skin: No rash Musculoskeletal: No spine tenderness   LAB:  CBC  Lab Results  Component Value Date   WBC 6.8 01/07/2019   HGB 14.2 01/07/2019   HCT 41.3 01/07/2019   MCV 101.5 (H) 01/07/2019   PLT 225 01/07/2019   NEUTROABS 4.6 01/07/2019    Blood smear: The red cell morphology is unremarkable, the polychromasia is not increased the platelets appear normal in number, the majority of the white cells are mature appearing neutrophils.  No blasts forms are present.  Some of the neutrophils are hypolobated and hypogranular.    CMP  Lab Results  Component Value Date   NA 138 10/01/2018   K 4.7 10/01/2018   CL 102 10/01/2018   CO2 29 10/01/2018   GLUCOSE  83 10/01/2018   BUN 15 10/01/2018   CREATININE 0.74 10/01/2018   CALCIUM 9.6 10/01/2018   PROT 6.9 11/30/2018   ALBUMIN 4.2 10/01/2018   AST 15 10/01/2018   ALT 16 10/01/2018   ALKPHOS 77 10/01/2018   BILITOT 1.5 (H) 10/01/2018   GFRNONAA 117.62 03/02/2010       Assessment/Plan:   1. Red cell macrocytosis-chronic  Hypolobated and hypogranular neutrophils noted on peripheral blood smear for 2020 2. Elevated IgA level 3. Hypothyroidism 4. Down syndrome 5. History of mild elevation of the bilirubin   Disposition:   Mr. Reaney is referred for hematologic evaluation red cell macrocytosis.  I  suspect the elevated MCV is a chronic finding related to Down syndrome.  I have a low clinical suspicion for a primary hematologic diagnosis.  He does not have evidence of hemolysis, chronic liver disease, hypothyroidism, a vitamin deficiency, or a hematopoietic malignancy.  The white cell changes noted on the peripheral blood smear today may be a normal variant.  I have a low clinical suspicion for myelodysplasia.  Review of the medical record indicates chronic mild elevation of bilirubin.  He may have Gilbert's.  The elevated IgA may also be related to Down syndrome and is likely a benign finding.  No monoclonal protein was noted on serum immunofixation last month.  I discussed the likely benign nature of the hematologic findings with his mother.  He will return for an office visit and CBC in 1 year.  The weight loss is most likely related to a change in diet.  She will monitor his weight monthly and contact us if he continues to lose weight.  Betsy Coder, MD  01/07/2019, 5:17 PM

## 2019-01-08 ENCOUNTER — Telehealth: Payer: Self-pay | Admitting: Oncology

## 2019-01-08 LAB — KAPPA/LAMBDA LIGHT CHAINS
Kappa free light chain: 40.1 mg/L — ABNORMAL HIGH (ref 3.3–19.4)
Kappa, lambda light chain ratio: 1.78 — ABNORMAL HIGH (ref 0.26–1.65)
Lambda free light chains: 22.5 mg/L (ref 5.7–26.3)

## 2019-01-08 NOTE — Telephone Encounter (Signed)
Called and left msg. Mailed printout  °

## 2019-01-11 ENCOUNTER — Other Ambulatory Visit: Payer: Self-pay | Admitting: Critical Care Medicine

## 2019-01-11 DIAGNOSIS — Z20822 Contact with and (suspected) exposure to covid-19: Secondary | ICD-10-CM

## 2019-01-11 LAB — IMMUNOFIXATION ELECTROPHORESIS
IgA: 529 mg/dL — ABNORMAL HIGH (ref 90–386)
IgG (Immunoglobin G), Serum: 1130 mg/dL (ref 603–1613)
IgM (Immunoglobulin M), Srm: 32 mg/dL (ref 20–172)
Total Protein ELP: 7 g/dL (ref 6.0–8.5)

## 2019-01-11 NOTE — Progress Notes (Signed)
Needs Covid test as exposed to father who is covid positive.

## 2019-01-12 ENCOUNTER — Telehealth: Payer: Self-pay

## 2019-01-12 ENCOUNTER — Other Ambulatory Visit: Payer: Self-pay

## 2019-01-12 DIAGNOSIS — R6889 Other general symptoms and signs: Secondary | ICD-10-CM | POA: Diagnosis not present

## 2019-01-12 DIAGNOSIS — Z20822 Contact with and (suspected) exposure to covid-19: Secondary | ICD-10-CM

## 2019-01-12 NOTE — Telephone Encounter (Signed)
Per Dr Benay Spice TC to patients mother, I let her know that there were no monoclonal protein seen on serum immunofixation, follow-up scheduled. Mother verbalized understanding. No further problems or concerns at this time.

## 2019-01-13 LAB — NOVEL CORONAVIRUS, NAA: SARS-CoV-2, NAA: NOT DETECTED

## 2019-01-14 ENCOUNTER — Encounter: Payer: Self-pay | Admitting: Oncology

## 2019-01-15 ENCOUNTER — Encounter: Payer: Self-pay | Admitting: *Deleted

## 2019-01-18 ENCOUNTER — Other Ambulatory Visit: Payer: Self-pay | Admitting: *Deleted

## 2019-01-18 DIAGNOSIS — R718 Other abnormality of red blood cells: Secondary | ICD-10-CM

## 2019-01-21 ENCOUNTER — Other Ambulatory Visit: Payer: Self-pay

## 2019-01-21 DIAGNOSIS — Z20828 Contact with and (suspected) exposure to other viral communicable diseases: Secondary | ICD-10-CM | POA: Diagnosis not present

## 2019-01-21 DIAGNOSIS — Z20822 Contact with and (suspected) exposure to covid-19: Secondary | ICD-10-CM

## 2019-01-22 LAB — NOVEL CORONAVIRUS, NAA: SARS-CoV-2, NAA: NOT DETECTED

## 2019-02-02 ENCOUNTER — Telehealth: Payer: Self-pay

## 2019-02-02 MED ORDER — SYNTHROID 25 MCG PO TABS: ORAL_TABLET | ORAL | 2 refills | Status: DC

## 2019-02-02 MED FILL — SYNTHROID 25 MCG TABLET: 25 | 90 days supply | Qty: 135 | Fill #0

## 2019-02-02 NOTE — Telephone Encounter (Signed)
Copied from [place] [id number]. Topic: General - Other >> Feb 02, 2019  9:55 AM Carolyn Stare wrote: Mom call to ask why pt RX for SYNTHROID 25 MCG tablet   was only 30 days and not 90 days

## 2019-04-15 ENCOUNTER — Ambulatory Visit: Payer: 59 | Attending: Internal Medicine

## 2019-04-15 DIAGNOSIS — Z20828 Contact with and (suspected) exposure to other viral communicable diseases: Secondary | ICD-10-CM | POA: Diagnosis not present

## 2019-04-15 DIAGNOSIS — Z20822 Contact with and (suspected) exposure to covid-19: Secondary | ICD-10-CM

## 2019-04-17 LAB — NOVEL CORONAVIRUS, NAA: SARS-CoV-2, NAA: NOT DETECTED

## 2019-04-26 ENCOUNTER — Other Ambulatory Visit: Payer: 59

## 2019-04-30 MED FILL — SYNTHROID 25 MCG TABLET: 25 | 90 days supply | Qty: 135 | Fill #1

## 2019-07-27 MED FILL — SYNTHROID 25 MCG TABLET: 25 | 90 days supply | Qty: 135 | Fill #2

## 2019-10-04 ENCOUNTER — Other Ambulatory Visit: Payer: Self-pay

## 2019-10-04 ENCOUNTER — Telehealth: Payer: Self-pay | Admitting: Internal Medicine

## 2019-10-04 MED ORDER — SYNTHROID 25 MCG PO TABS: ORAL_TABLET | ORAL | 0 refills | Status: DC

## 2019-10-04 NOTE — Telephone Encounter (Signed)
Patient has a physical scheduled for August 31st, however he needs a prescription for Synthroid called in.  He will be out as of July 14th.  He will be out of town the week before this.  Patient usually gets a 90 day supply.  Please reply on MyChart letting the mom know it has been sent.

## 2019-10-04 NOTE — Telephone Encounter (Signed)
I have sent a 90 day refill in to the [facility] and sent a MyChart message to Grayland Ormond the patients mother.

## 2019-10-12 DIAGNOSIS — H5213 Myopia, bilateral: Secondary | ICD-10-CM | POA: Diagnosis not present

## 2019-10-12 DIAGNOSIS — H25013 Cortical age-related cataract, bilateral: Secondary | ICD-10-CM | POA: Diagnosis not present

## 2019-10-12 DIAGNOSIS — H52213 Irregular astigmatism, bilateral: Secondary | ICD-10-CM | POA: Diagnosis not present

## 2019-10-18 MED FILL — SYNTHROID 25 MCG TABLET: 25 | 90 days supply | Qty: 135 | Fill #0

## 2019-11-23 IMAGING — CR CHEST - 2 VIEW
2 series · 2 of 2 positions shown · non-contrast
Comparison: None.

CLINICAL DATA: Unexplained weight loss.

EXAM:
CHEST - 2 VIEW

[w chest pa]
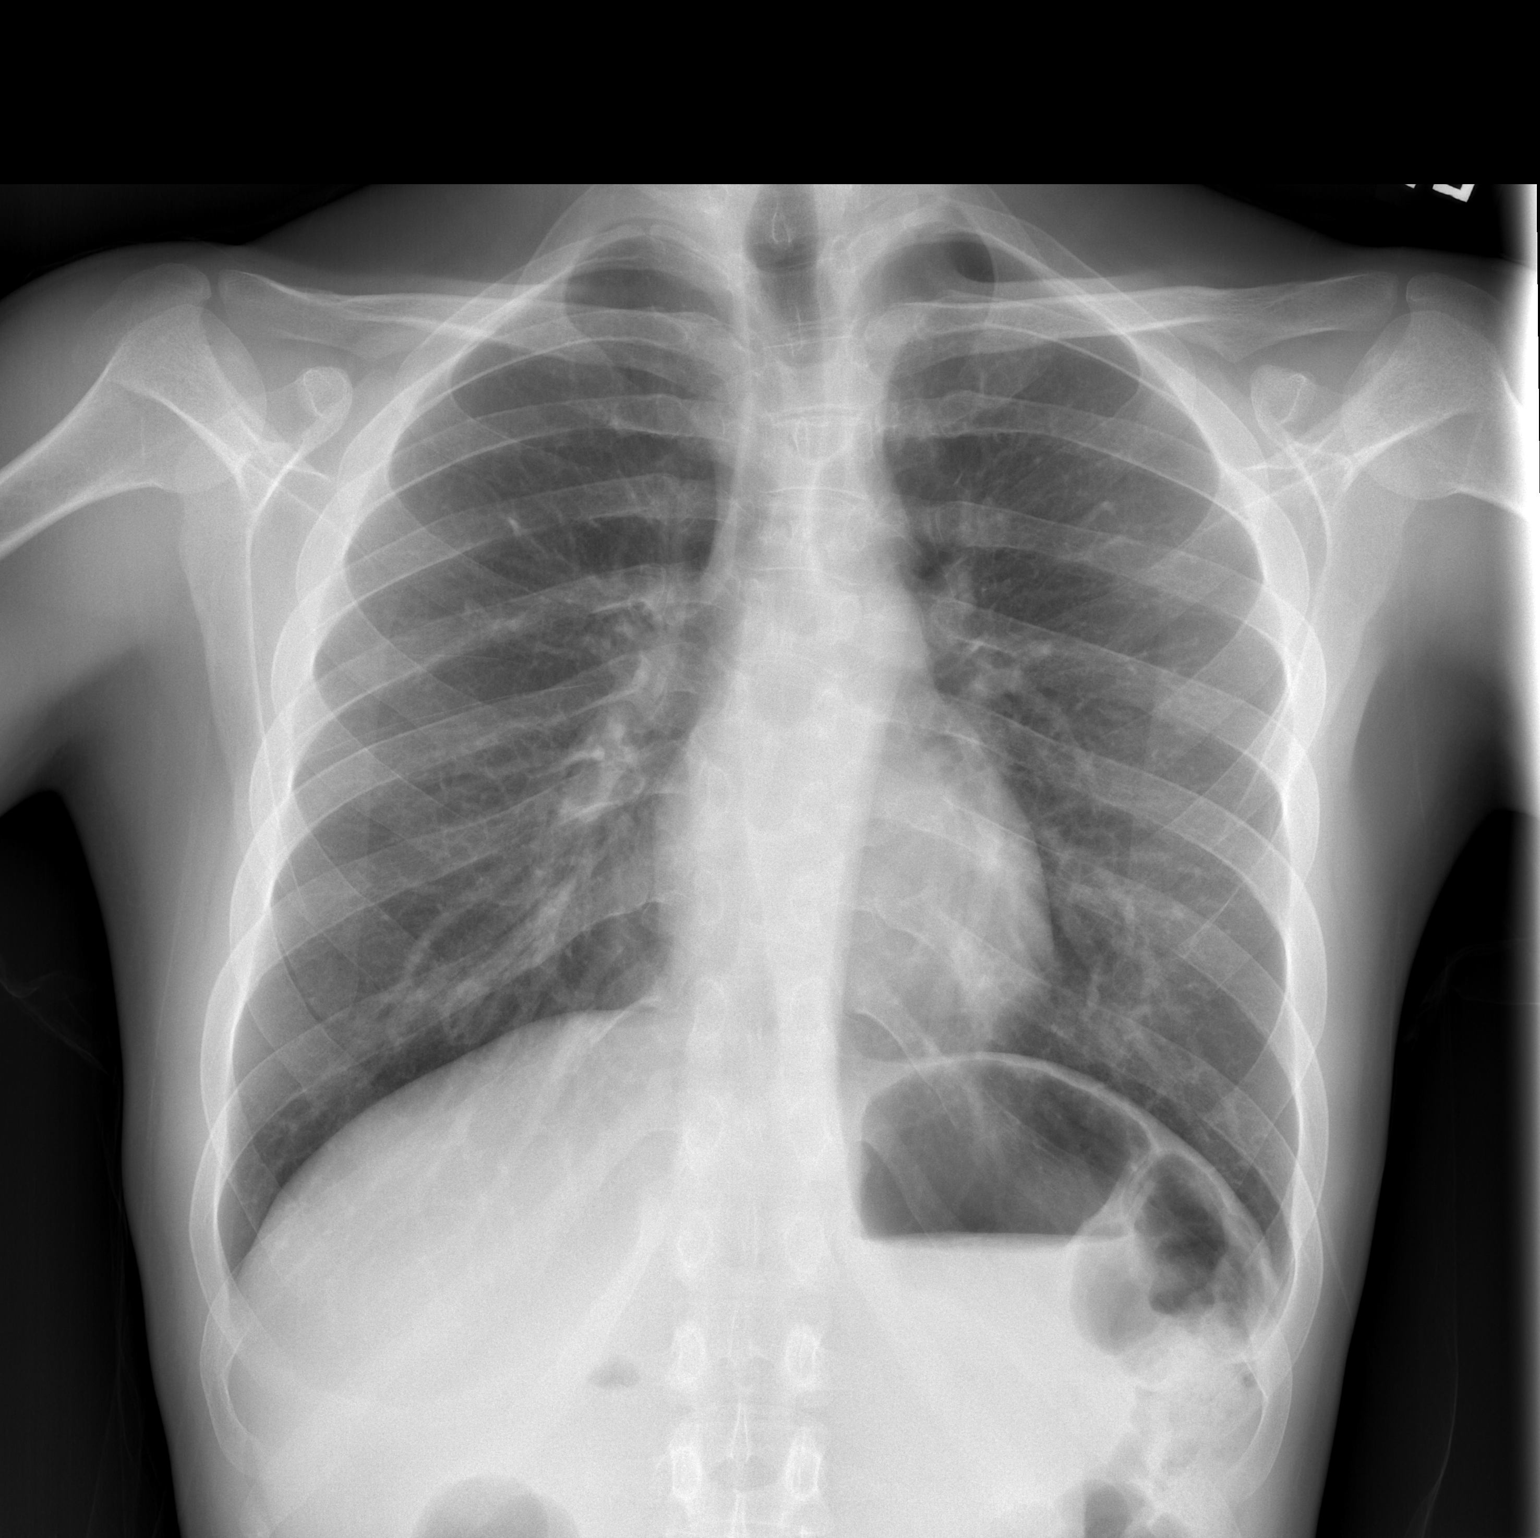

[w chest lat]
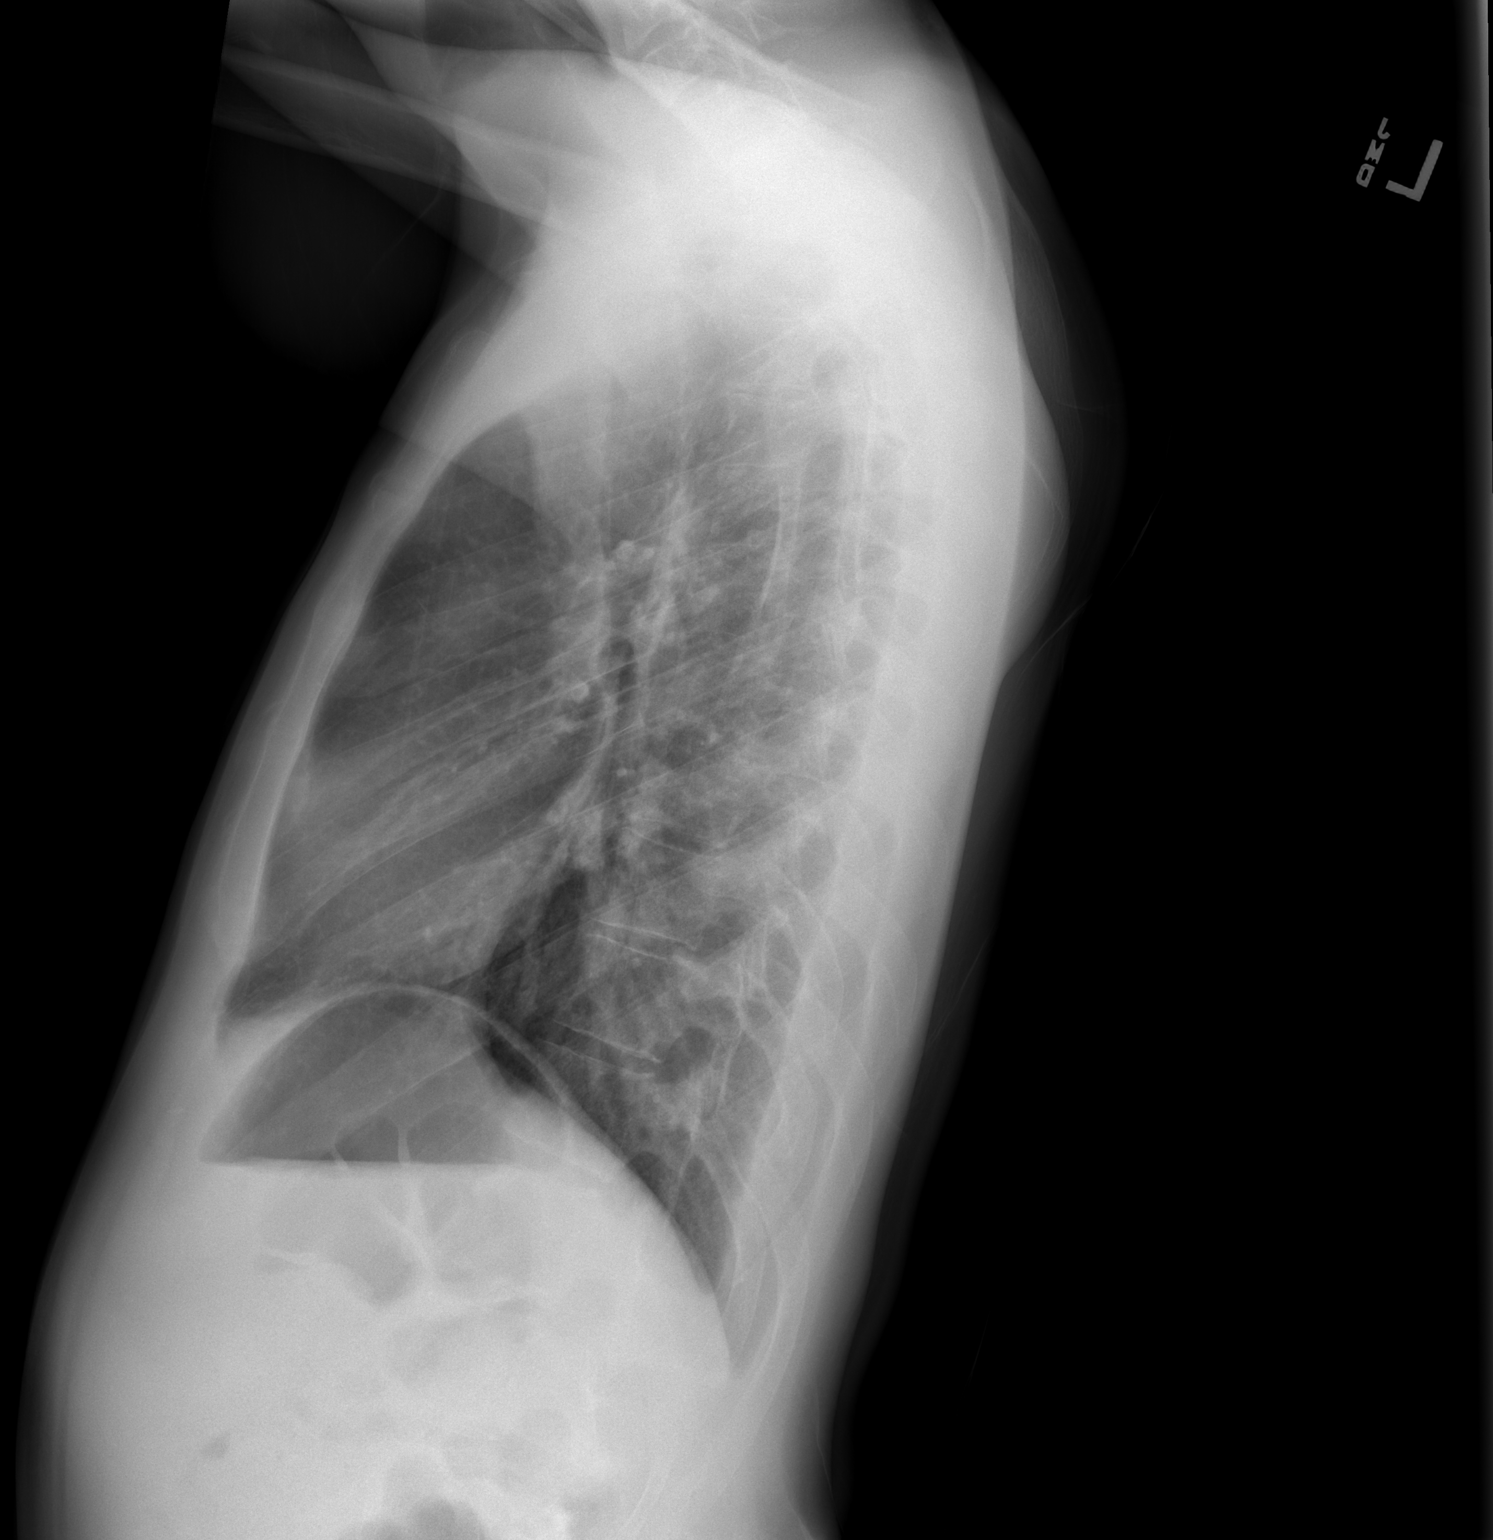

[2 of 2 positions shown; findings below may reference images not displayed]

FINDINGS: The heart size and pulmonary vascularity are normal and the lungs
are clear. No effusions. Minimal thoracic scoliosis with convexity
to the left centered at T4.

The bones are otherwise normal. Specifically, no evidence of lytic
lesions.
IMPRESSION: Essentially normal chest.

## 2019-12-13 ENCOUNTER — Other Ambulatory Visit: Payer: Self-pay

## 2019-12-13 NOTE — Progress Notes (Signed)
Chief Complaint  Patient presents with  . Annual Exam    Doing well    HPI: Patient  Roy Carr  34 y.o. comes in today for Preventive Health Care visit  With mom . He has been well  But has lost more weight  Eats reg not a lot of snacking drinks apple juice and water   Lost more weight     Around  116 and 120    Before  No gi gu sx  Skin still flaky and gets  Follicles and wonders if some could be hidradenitis  Using a otc silver based and helps a lot   Has fu with Dr Benay Spice  Next month  For elevated IGA and weight loss   Health Maintenance  Topic Date Due  . Hepatitis C Screening  Never done  . HIV Screening  Never done  . INFLUENZA VACCINE  11/14/2019  . TETANUS/TDAP  12/10/2028  . COVID-19 Vaccine  Completed   Health Maintenance Review LIFESTYLE:  Exercise:   Neg tadk Sleep:goo d noosa Drug use: no HH of 2 Work: [employer]    ROS:  GEN/ HEENT: No fever,  sweats headaches vision problems hearing changes, CV/ PULM; No chest pain shortness of breath cough, syncope,edema  change in exercise tolerance. GI /GU: No adominal pain, vomiting, change in bowel habits. No blood in the stool. No significant GU symptoms. SKIN/HEME: ,no acute skin rashes suspicious lesions or bleeding. No lymphadenopathy, nodules, masses.  NEURO/ PSYCH:  No neurologic signs such as weakness numbness. No depression anxiety. IMM/ Allergy: No unusual infections.  Allergy .   REST of 12 system review negative except as per HPI   Past Medical History:  Diagnosis Date  . Hypothyroid    prev per Dr Tobe Sos   . Personal history of spine surgery   . Spondylisthesis    grade 2 corrected  . Trisomy 21   . Varicose veins    intervention 5 2011    Past Surgical History:  Procedure Laterality Date  . ABLATION SAPHENOUS VEIN W/ RFA    . SPINE SURGERY    . WISDOM TOOTH EXTRACTION      Family History  Problem Relation Age of Onset  . Melanoma Father     Social History   Socioeconomic  History  . Marital status: Single    Spouse name: Not on file  . Number of children: Not on file  . Years of education: Not on file  . Highest education level: Not on file  Occupational History  . Not on file  Tobacco Use  . Smoking status: Never Smoker  . Smokeless tobacco: Never Used  Vaping Use  . Vaping Use: Never used  Substance and Sexual Activity  . Alcohol use: No  . Drug use: No  . Sexual activity: Not on file  Other Topics Concern  . 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 Difficulty of Paying Living Expenses: Not on file  Food Insecurity:   . Worried  About Running Out of Food in the Last Year: Not on file  . Ran Out of Food in the Last Year: Not on file  Transportation Needs:   . Lack of Transportation (Medical): Not on file  . Lack of Transportation (Non-Medical): Not on file  Physical Activity:   . Days of Exercise per Week: Not on file  . Minutes of Exercise per Session: Not on file  Stress:   . Feeling of Stress : Not on file  Social Connections:   . Frequency of Communication with Friends and Family: Not on file  . Frequency of Social Gatherings with Friends and Family: Not on file  . Attends Religious Services: Not on file  . Active Member of Clubs or Organizations: Not on file  . Attends Archivist Meetings: Not on file  . Marital Status: Not on file    Outpatient Medications Prior to Visit  Medication Sig Dispense Refill  . Multiple Vitamins-Minerals (MULTIVITAMIN ADULT) CHEW Chew 1 tablet by mouth daily.    Marland Kitchen SYNTHROID 25 MCG tablet Take 1 and half each day 135 tablet 0  . valACYclovir (VALTREX) 1000 MG tablet  TAKE 2 TABLETS BY MOUTH TWICE DAILY FOR COLD SORES 30 tablet PRN   No facility-administered medications prior to visit.     EXAM:  BP 116/72   Pulse (!) 54   Temp 97.7 F (36.5 C) (Oral)   Ht 5' 4.25" (1.632 m)   Wt 113 lb 12.8 oz (51.6 kg)   SpO2 97%   BMI 19.38 kg/m   Body mass index is 19.38 kg/m. Wt Readings from Last 3 Encounters:  12/14/19 113 lb 12.8 oz (51.6 kg)  01/07/19 122 lb 14.4 oz (55.7 kg)  12/11/18 120 lb (54.4 kg)    Physical Exam: Vital signs reviewed HQP:RFFM is a well-developed well-nourished alert cooperative    who appearsr stated age in no acute distress. w  Cherlyn Cushing trisomy 21  Habitus  HEENT:microcephalic c atraumatic , Eyes: PERRL EOM's full, conjunctiva clear, Nares: paten,t no deformity discharge or tenderness., Ears: no deformity EAC's scaly tpatent  1= wax  TMs nad Mouth: masked NECK:without masses, thyromegaly or bruits. CHEST/PULM:  Clear to auscultation and percussion breath sounds equal no wheeze , rales or rhonchi. No chest wall deformities or tenderness. CV: PMI is nondisplaced, S1 S2 no gallops, murmurs, rubs. Peripheral pulses are full without delay.No JVD .  ABDOMEN: Bowel sounds normal nontender  No guard or rebound, no hepato splenomegal no CVA tenderness. Extremtities:  No clubbing cyanosis or edema, no acute joint swelling or redness no focal atrophy NEURO:  cranial nerves 3-12 appear to be intact, no obvious focal weakness,gait within symmetrical  SKIN: No acute rashes normal turgor, color, no bruising or petechiae. Dry  Rough somewhat  No scaling  Feet  Some dool plethora erythema  No ulcers  Pulses nl  PSYCH: pleasant interactive  Follows direction well.  LN: no cervical axillary inguinal adenopathy  Lab Results  Component Value Date   WBC 6.8 01/07/2019   HGB 14.2 01/07/2019   HCT 41.3 01/07/2019   PLT 225 01/07/2019   GLUCOSE 83 10/01/2018   CHOL 193 10/01/2018   TRIG 130.0 10/01/2018   HDL 38.40 (L) 10/01/2018   LDLDIRECT  160.4 07/03/2012   LDLCALC 128 (H) 10/01/2018   ALT 16 10/01/2018   AST 15 10/01/2018   NA 138 10/01/2018   K 4.7 10/01/2018   CL 102 10/01/2018   CREATININE 0.74 10/01/2018   BUN  15 10/01/2018   CO2 29 10/01/2018   TSH 3.09 10/01/2018    BP Readings from Last 3 Encounters:  12/14/19 116/72  01/07/19 109/66  12/11/18 90/70   Wt Readings from Last 3 Encounters:  12/14/19 113 lb 12.8 oz (51.6 kg)  01/07/19 122 lb 14.4 oz (55.7 kg)  12/11/18 120 lb (54.4 kg)    Lab plan   ASSESSMENT AND PLAN:  Discussed the following assessment and plan:    ICD-10-CM   1. Visit for preventive health examination  Z00.00 CBC with Differential/Platelet    Lipid panel    TSH    T4, free    T3, free    Celiac Disease Comprehensive Panel with Reflexes    Thyroid peroxidase antibody    C-reactive protein    POCT Urinalysis Dipstick (Automated)    CMP with eGFR(Quest)    CMP with eGFR(Quest)    C-reactive protein    Thyroid peroxidase antibody    Celiac Disease Comprehensive Panel with Reflexes    T3, free    T4, free    TSH    Lipid panel    CBC with Differential/Platelet  2. Trisomy 21  Q90.9 CBC with Differential/Platelet    Lipid panel    TSH    T4, free    T3, free    Celiac Disease Comprehensive Panel with Reflexes    Thyroid peroxidase antibody    C-reactive protein    POCT Urinalysis Dipstick (Automated)    CMP with eGFR(Quest)    CMP with eGFR(Quest)    C-reactive protein    Thyroid peroxidase antibody    Celiac Disease Comprehensive Panel with Reflexes    T3, free    T4, free    TSH    Lipid panel    CBC with Differential/Platelet  3. Elevated MCV  R71.8 CBC with Differential/Platelet    Lipid panel    TSH    T4, free    T3, free    Celiac Disease Comprehensive Panel with Reflexes    Thyroid peroxidase antibody    C-reactive protein    POCT Urinalysis Dipstick (Automated)    CMP with eGFR(Quest)    CMP with eGFR(Quest)    C-reactive protein    Thyroid  peroxidase antibody    Celiac Disease Comprehensive Panel with Reflexes    T3, free    T4, free    TSH    Lipid panel    CBC with Differential/Platelet  4. Hypothyroidism, unspecified type  E03.9 CBC with Differential/Platelet    Lipid panel    TSH    T4, free    T3, free    Celiac Disease Comprehensive Panel with Reflexes    Thyroid peroxidase antibody    C-reactive protein    POCT Urinalysis Dipstick (Automated)    CMP with eGFR(Quest)    CMP with eGFR(Quest)    C-reactive protein    Thyroid peroxidase antibody    Celiac Disease Comprehensive Panel with Reflexes    T3, free    T4, free    TSH    Lipid panel    CBC with Differential/Platelet  5. Medication management  Z79.899 CBC with Differential/Platelet    Lipid panel    TSH    T4, free    T3, free    Celiac Disease Comprehensive Panel with Reflexes    Thyroid peroxidase antibody    C-reactive protein    POCT Urinalysis Dipstick (Automated)    CMP with eGFR(Quest)    CMP  with eGFR(Quest)    C-reactive protein    Thyroid peroxidase antibody    Celiac Disease Comprehensive Panel with Reflexes    T3, free    T4, free    TSH    Lipid panel    CBC with Differential/Platelet  6. Weight loss  R63.4 CBC with Differential/Platelet    Lipid panel    TSH    T4, free    T3, free    Celiac Disease Comprehensive Panel with Reflexes    Thyroid peroxidase antibody    C-reactive protein    POCT Urinalysis Dipstick (Automated)    CMP with eGFR(Quest)    CMP with eGFR(Quest)    C-reactive protein    Thyroid peroxidase antibody    Celiac Disease Comprehensive Panel with Reflexes    T3, free    T4, free    TSH    Lipid panel    CBC with Differential/Platelet   unrevealing  exam   looks welll but  losing weight still  Without sx  Plant based diet per mom  Skin issues  Over time using topical silver based and helpful .   Has lost 9 # over past year .     Return in about 1 year (around 12/13/2020) for depending on  results, preventive /cpx and medications.  Patient Care Team: Travontae Freiberger, Standley Brooking, MD as PCP - General Calvert Cantor, MD as Attending Physician (Ophthalmology) Danella Sensing, MD as Consulting Physician (Dermatology) Patient Instructions  Will share lab results with Dr Benay Spice.  Exam is  Unremarkable today   Plan follow up depending    [place]. Tamikka Pilger M.D.

## 2019-12-14 ENCOUNTER — Ambulatory Visit (INDEPENDENT_AMBULATORY_CARE_PROVIDER_SITE_OTHER): Payer: 59 | Admitting: Internal Medicine

## 2019-12-14 ENCOUNTER — Encounter: Payer: Self-pay | Admitting: Internal Medicine

## 2019-12-14 VITALS — BP 116/72 | HR 54 | Temp 97.7°F | Ht 64.25 in | Wt 113.8 lb

## 2019-12-14 DIAGNOSIS — Z79899 Other long term (current) drug therapy: Secondary | ICD-10-CM | POA: Diagnosis not present

## 2019-12-14 DIAGNOSIS — R634 Abnormal weight loss: Secondary | ICD-10-CM | POA: Diagnosis not present

## 2019-12-14 DIAGNOSIS — Q909 Down syndrome, unspecified: Secondary | ICD-10-CM

## 2019-12-14 DIAGNOSIS — Z0000 Encounter for general adult medical examination without abnormal findings: Secondary | ICD-10-CM

## 2019-12-14 DIAGNOSIS — R718 Other abnormality of red blood cells: Secondary | ICD-10-CM

## 2019-12-14 DIAGNOSIS — E039 Hypothyroidism, unspecified: Secondary | ICD-10-CM | POA: Diagnosis not present

## 2019-12-14 LAB — POC URINALSYSI DIPSTICK (AUTOMATED)
Bilirubin, UA: NEGATIVE
Blood, UA: NEGATIVE
Glucose, UA: NEGATIVE
Ketones, UA: NEGATIVE
Leukocytes, UA: NEGATIVE
Nitrite, UA: NEGATIVE
Protein, UA: POSITIVE — AB
Spec Grav, UA: >=1.03 — AB (ref 1.010–1.025)
Urobilinogen, UA: 0.2 E.U./dL
pH, UA: 6 (ref 5.0–8.0)

## 2019-12-14 NOTE — Patient Instructions (Signed)
Will share lab results with Dr Benay Spice.  Exam is  Unremarkable today   Plan follow up depending

## 2019-12-15 LAB — CBC WITH DIFFERENTIAL/PLATELET
Absolute Monocytes: 426 cells/uL (ref 200–950)
Basophils Absolute: 111 cells/uL (ref 0–200)
Basophils Relative: 2.7 %
Eosinophils Absolute: 29 cells/uL (ref 15–500)
Eosinophils Relative: 0.7 %
HCT: 44.8 % (ref 38.5–50.0)
Hemoglobin: 15.6 g/dL (ref 13.2–17.1)
Lymphs Abs: 1279 cells/uL (ref 850–3900)
MCH: 35.5 pg — ABNORMAL HIGH (ref 27.0–33.0)
MCHC: 34.8 g/dL (ref 32.0–36.0)
MCV: 101.8 fL — ABNORMAL HIGH (ref 80.0–100.0)
MPV: 11.5 fL (ref 7.5–12.5)
Monocytes Relative: 10.4 %
Neutro Abs: 2255 cells/uL (ref 1500–7800)
Neutrophils Relative %: 55 %
Platelets: 235 10*3/uL (ref 140–400)
RBC: 4.4 10*6/uL (ref 4.20–5.80)
RDW: 12.2 % (ref 11.0–15.0)
Total Lymphocyte: 31.2 %
WBC: 4.1 10*3/uL (ref 3.8–10.8)

## 2019-12-15 LAB — COMPLETE METABOLIC PANEL WITH GFR
AG Ratio: 1.6 (calc) (ref 1.0–2.5)
ALT: 14 U/L (ref 9–46)
AST: 16 U/L (ref 10–40)
Albumin: 4.6 g/dL (ref 3.6–5.1)
Alkaline phosphatase (APISO): 79 U/L (ref 36–130)
BUN: 18 mg/dL (ref 7–25)
CO2: 24 mmol/L (ref 20–32)
Calcium: 9.8 mg/dL (ref 8.6–10.3)
Chloride: 104 mmol/L (ref 98–110)
Creat: 0.82 mg/dL (ref 0.60–1.35)
GFR, Est African American: 136 mL/min/{1.73_m2} (ref >=60)
GFR, Est Non African American: 117 mL/min/{1.73_m2} (ref >=60)
Globulin: 2.9 g/dL (calc) (ref 1.9–3.7)
Glucose, Bld: 88 mg/dL (ref 65–99)
Potassium: 4.6 mmol/L (ref 3.5–5.3)
Sodium: 138 mmol/L (ref 135–146)
Total Bilirubin: 1.4 mg/dL — ABNORMAL HIGH (ref 0.2–1.2)
Total Protein: 7.5 g/dL (ref 6.1–8.1)

## 2019-12-15 LAB — THYROID PEROXIDASE ANTIBODY: Thyroperoxidase Ab SerPl-aCnc: 38 IU/mL — ABNORMAL HIGH (ref <9)

## 2019-12-15 LAB — LIPID PANEL
Cholesterol: 221 mg/dL — ABNORMAL HIGH (ref <200)
HDL: 45 mg/dL (ref >=40)
LDL Cholesterol (Calc): 153 mg/dL (calc) — ABNORMAL HIGH
Non-HDL Cholesterol (Calc): 176 mg/dL (calc) — ABNORMAL HIGH (ref <130)
Total CHOL/HDL Ratio: 4.9 (calc) (ref <5.0)
Triglycerides: 113 mg/dL (ref <150)

## 2019-12-15 LAB — CELIAC DISEASE COMPREHENSIVE PANEL WITH REFLEXES
(tTG) Ab, IgA: <1 U/mL
Immunoglobulin A: 608 mg/dL — ABNORMAL HIGH (ref 47–310)

## 2019-12-15 LAB — C-REACTIVE PROTEIN: CRP: 0.4 mg/L (ref <8.0)

## 2019-12-15 LAB — T4, FREE: Free T4: 1.3 ng/dL (ref 0.8–1.8)

## 2019-12-15 LAB — TSH: TSH: 2.04 mIU/L (ref 0.40–4.50)

## 2019-12-15 LAB — T3, FREE: T3, Free: 3.4 pg/mL (ref 2.3–4.2)

## 2019-12-16 ENCOUNTER — Telehealth: Payer: Self-pay | Admitting: Oncology

## 2019-12-16 ENCOUNTER — Other Ambulatory Visit: Payer: 59

## 2019-12-16 NOTE — Telephone Encounter (Signed)
Rescheduled appointment per 9/2 provider message. Spoke with patient's mother who is aware of appointment change.

## 2019-12-17 DIAGNOSIS — Z20822 Contact with and (suspected) exposure to covid-19: Secondary | ICD-10-CM | POA: Diagnosis not present

## 2019-12-18 ENCOUNTER — Encounter: Payer: Self-pay | Admitting: Oncology

## 2019-12-21 ENCOUNTER — Other Ambulatory Visit: Payer: Self-pay | Admitting: Internal Medicine

## 2019-12-21 ENCOUNTER — Other Ambulatory Visit: Payer: Self-pay

## 2019-12-21 DIAGNOSIS — R809 Proteinuria, unspecified: Secondary | ICD-10-CM

## 2019-12-21 NOTE — Progress Notes (Signed)
So labs about the same  except  lipids up some ( different  reference lab also )  no defined explanation for  weight loss  Will forward results  to dr Benay Spice.  Urine clear  but screen pos  protein  but renal function nl  Get urine micro albumin /creatinine ratio   screen  to define if the urine screen is clinically significant .  I advise fu in 4 months if still losing weight  or if concerns

## 2019-12-22 ENCOUNTER — Other Ambulatory Visit: Payer: 59

## 2019-12-22 DIAGNOSIS — R809 Proteinuria, unspecified: Secondary | ICD-10-CM | POA: Diagnosis not present

## 2019-12-22 LAB — MICROALBUMIN / CREATININE URINE RATIO
Creatinine,U: 151.8 mg/dL
Microalb Creat Ratio: 0.6 mg/g (ref 0.0–30.0)
Microalb, Ur: 0.9 mg/dL (ref 0.0–1.9)

## 2019-12-22 MED FILL — valACYclovir HCL 1 GM TABS: 1 | 8 days supply | Qty: 30 | Fill #0

## 2019-12-22 NOTE — Progress Notes (Signed)
Normal   no increase  in protein in urine screen . reassuring

## 2020-01-06 ENCOUNTER — Ambulatory Visit: Payer: 59 | Admitting: Oncology

## 2020-01-06 ENCOUNTER — Other Ambulatory Visit: Payer: 59

## 2020-01-11 ENCOUNTER — Inpatient Hospital Stay: Payer: 59 | Attending: Oncology

## 2020-01-11 ENCOUNTER — Inpatient Hospital Stay (HOSPITAL_BASED_OUTPATIENT_CLINIC_OR_DEPARTMENT_OTHER): Payer: 59 | Admitting: Oncology

## 2020-01-11 ENCOUNTER — Other Ambulatory Visit: Payer: Self-pay

## 2020-01-11 VITALS — BP 107/80 | HR 69 | Temp 97.7°F | Resp 18 | Ht 64.0 in | Wt 128.3 lb

## 2020-01-11 DIAGNOSIS — Z79899 Other long term (current) drug therapy: Secondary | ICD-10-CM | POA: Diagnosis not present

## 2020-01-11 DIAGNOSIS — R718 Other abnormality of red blood cells: Secondary | ICD-10-CM | POA: Diagnosis not present

## 2020-01-11 DIAGNOSIS — E039 Hypothyroidism, unspecified: Secondary | ICD-10-CM | POA: Diagnosis not present

## 2020-01-11 DIAGNOSIS — Q909 Down syndrome, unspecified: Secondary | ICD-10-CM | POA: Insufficient documentation

## 2020-01-11 DIAGNOSIS — R635 Abnormal weight gain: Secondary | ICD-10-CM | POA: Diagnosis not present

## 2020-01-11 DIAGNOSIS — R768 Other specified abnormal immunological findings in serum: Secondary | ICD-10-CM | POA: Insufficient documentation

## 2020-01-11 NOTE — Progress Notes (Signed)
  [facility] OFFICE PROGRESS NOTE   Diagnosis: Elevated IgA level, elevated MCV  INTERVAL HISTORY:   Roy Carr returns for a scheduled visit.  He is here today with his mother.  No specific complaint.  Good appetite.  No fever or night sweats.  He is working.  Objective:  Vital signs in last 24 hours:  Blood pressure 107/80, pulse 69, temperature 97.7 F (36.5 C), temperature source Tympanic, resp. rate 18, height $RemoveBe'5\' 4"'hRVoUAcJF$  (1.626 m), weight 128 lb 4.8 oz (58.2 kg), SpO2 100 %.    Lymphatics: No cervical, supraclavicular, axillary, or inguinal nodes Resp: Lungs clear bilaterally Cardio: Regular rate and rhythm GI: No hepatosplenomegaly Vascular: No leg edema  Skin: Mild confluent erythema over the head and extremities in a sun exposure distribution    Lab Results:  Lab Results  Component Value Date   WBC 4.1 12/14/2019   HGB 15.6 12/14/2019   HCT 44.8 12/14/2019   MCV 101.8 (H) 12/14/2019   PLT 235 12/14/2019   NEUTROABS 2,255 12/14/2019    CMP  Lab Results  Component Value Date   NA 138 12/14/2019   K 4.6 12/14/2019   CL 104 12/14/2019   CO2 24 12/14/2019   GLUCOSE 88 12/14/2019   BUN 18 12/14/2019   CREATININE 0.82 12/14/2019   CALCIUM 9.8 12/14/2019   PROT 7.5 12/14/2019   ALBUMIN 4.2 10/01/2018   AST 16 12/14/2019   ALT 14 12/14/2019   ALKPHOS 77 10/01/2018   BILITOT 1.4 (H) 12/14/2019   GFRNONAA 117 12/14/2019   GFRAA 136 12/14/2019   IgA 608  Medications: I have reviewed the patient's current medications.   Assessment/Plan: 1. Red cell macrocytosis-chronic  Hypolobated and hypogranular neutrophils noted on peripheral blood smear for 2020 2. Elevated IgA level 3. Hypothyroidism 4. Down syndrome 5. History of mild elevation of the bilirubin    Disposition: Roy Carr appears stable.  He has persistent elevation of the MCV and IgA level.  I suspect these are benign findings, most likely related to Down's syndrome.  He is not  anemic.  He does not have other clinical or laboratory features of multiple myeloma.  A serum immunofixation last year did not reveal a monoclonal protein.  He has gained weight compared to when he was here last year.  We will follow up on the serum immunofixation and serum free light chains from today.  I recommend Dr. Regis Bill obtain a CBC and IgA level on a yearly schedule.  Roy Carr is not scheduled for a follow-up appointment in the hematology clinic.  I am available to see him in the future as needed.  Betsy Coder, MD  01/11/2020  3:11 PM

## 2020-01-12 ENCOUNTER — Telehealth: Payer: Self-pay | Admitting: Oncology

## 2020-01-12 LAB — KAPPA/LAMBDA LIGHT CHAINS
Kappa free light chain: 45 mg/L — ABNORMAL HIGH (ref 3.3–19.4)
Kappa, lambda light chain ratio: 1.59 (ref 0.26–1.65)
Lambda free light chains: 28.3 mg/L — ABNORMAL HIGH (ref 5.7–26.3)

## 2020-01-12 NOTE — Telephone Encounter (Signed)
No new appointments scheduled per 9/28 los.

## 2020-01-13 ENCOUNTER — Encounter: Payer: Self-pay | Admitting: Oncology

## 2020-01-13 LAB — IMMUNOFIXATION ELECTROPHORESIS
IgA: 501 mg/dL — ABNORMAL HIGH (ref 90–386)
IgG (Immunoglobin G), Serum: 1125 mg/dL (ref 603–1613)
IgM (Immunoglobulin M), Srm: 28 mg/dL (ref 20–172)
Total Protein ELP: 6.8 g/dL (ref 6.0–8.5)

## 2020-01-18 ENCOUNTER — Other Ambulatory Visit: Payer: Self-pay | Admitting: Internal Medicine

## 2020-01-18 MED FILL — SYNTHROID 25 MCG TABLET: 25 | 90 days supply | Qty: 135 | Fill #0

## 2020-02-23 DIAGNOSIS — E039 Hypothyroidism, unspecified: Secondary | ICD-10-CM | POA: Diagnosis not present

## 2020-02-23 DIAGNOSIS — Q909 Down syndrome, unspecified: Secondary | ICD-10-CM | POA: Diagnosis not present

## 2020-02-23 DIAGNOSIS — E0789 Other specified disorders of thyroid: Secondary | ICD-10-CM | POA: Diagnosis not present

## 2020-02-23 DIAGNOSIS — R718 Other abnormality of red blood cells: Secondary | ICD-10-CM | POA: Diagnosis not present

## 2020-02-23 DIAGNOSIS — R768 Other specified abnormal immunological findings in serum: Secondary | ICD-10-CM | POA: Diagnosis not present

## 2020-02-23 DIAGNOSIS — R634 Abnormal weight loss: Secondary | ICD-10-CM | POA: Diagnosis not present

## 2020-02-23 DIAGNOSIS — B001 Herpesviral vesicular dermatitis: Secondary | ICD-10-CM | POA: Diagnosis not present

## 2020-04-10 ENCOUNTER — Other Ambulatory Visit: Payer: Self-pay | Admitting: Internal Medicine

## 2020-04-12 MED FILL — SYNTHROID 25 MCG TABLET: 25 | 90 days supply | Qty: 135 | Fill #0

## 2020-07-12 ENCOUNTER — Other Ambulatory Visit: Payer: Self-pay | Admitting: Internal Medicine

## 2020-07-12 MED FILL — SYNTHROID 25 MCG TABLET: 25 | 90 days supply | Qty: 135 | Fill #0

## 2020-07-31 ENCOUNTER — Other Ambulatory Visit (HOSPITAL_BASED_OUTPATIENT_CLINIC_OR_DEPARTMENT_OTHER): Payer: Self-pay

## 2020-07-31 ENCOUNTER — Ambulatory Visit: Payer: 59 | Attending: Internal Medicine

## 2020-07-31 DIAGNOSIS — Z23 Encounter for immunization: Secondary | ICD-10-CM

## 2020-07-31 MED ORDER — COVID-19 MRNA VACCINE (PFIZER) 30 MCG/0.3ML IM SUSP
INTRAMUSCULAR | 0 refills | Status: DC
  Filled 2020-07-31: qty 0.3, 1d supply, fill #0

## 2020-07-31 NOTE — Progress Notes (Signed)
   Covid-19 Vaccination Clinic  Name:  Roy Carr    MRN: [id number] DOB: 1985-06-04  07/31/2020  [name] was observed post Covid-19 immunization for 15 minutes without incident. He was provided with Vaccine Information Sheet and instruction to access the V-Safe system.   [name] was instructed to call 911 with any severe reactions post vaccine: Marland Kitchen Difficulty breathing  . Swelling of face and throat  . A fast heartbeat  . A bad rash all over body  . Dizziness and weakness   Immunizations Administered    Name Date Dose VIS Date Route   PFIZER Comrnaty(Gray TOP) Covid-19 Vaccine 07/31/2020  3:00 PM 0.3 mL 03/23/2020 Intramuscular   Manufacturer: [place]   Lot: [id number]   NDC: [phone]

## 2020-10-05 ENCOUNTER — Other Ambulatory Visit (HOSPITAL_COMMUNITY): Payer: Self-pay

## 2020-10-05 ENCOUNTER — Other Ambulatory Visit: Payer: Self-pay | Admitting: Internal Medicine

## 2020-10-05 MED ORDER — SYNTHROID 25 MCG PO TABS
ORAL_TABLET | ORAL | 0 refills | Status: DC
  Filled 2020-10-05: qty 135, 90d supply, fill #0

## 2020-10-06 ENCOUNTER — Other Ambulatory Visit (HOSPITAL_COMMUNITY): Payer: Self-pay

## 2020-12-14 ENCOUNTER — Telehealth: Payer: Self-pay | Admitting: Internal Medicine

## 2020-12-14 NOTE — Telephone Encounter (Signed)
PT mom Baily Magro called wanting to speak to Office manager to see if Dr.Panosh is going to be participating with the [facility]. She gives permission to leave a msg if no answer on [phone].

## 2020-12-22 ENCOUNTER — Other Ambulatory Visit (HOSPITAL_COMMUNITY): Payer: Self-pay

## 2021-01-06 ENCOUNTER — Other Ambulatory Visit: Payer: Self-pay | Admitting: Internal Medicine

## 2021-01-06 ENCOUNTER — Other Ambulatory Visit (HOSPITAL_COMMUNITY): Payer: Self-pay

## 2021-01-08 ENCOUNTER — Other Ambulatory Visit (HOSPITAL_COMMUNITY): Payer: Self-pay

## 2021-01-08 ENCOUNTER — Telehealth: Payer: Self-pay | Admitting: Internal Medicine

## 2021-01-08 MED ORDER — SYNTHROID 25 MCG PO TABS
ORAL_TABLET | ORAL | 0 refills | Status: DC
  Filled 2021-01-08 – 2021-01-10 (×2): qty 45, 30d supply, fill #0

## 2021-01-10 ENCOUNTER — Other Ambulatory Visit (HOSPITAL_COMMUNITY): Payer: Self-pay

## 2021-01-11 ENCOUNTER — Other Ambulatory Visit (HOSPITAL_COMMUNITY): Payer: Self-pay

## 2021-01-11 MED ORDER — SYNTHROID 25 MCG PO TABS
ORAL_TABLET | ORAL | 0 refills | Status: DC
  Filled 2021-01-11: qty 45, 30d supply, fill #0

## 2021-01-11 NOTE — Telephone Encounter (Signed)
PT called to advise that the SYNTHROID 25 MCG tablet that was called in was only for a month and she needs it to be called in for 3 months. Please advise and resend to the Pharmacy on file for 3 month supply of SYNTHROID 25 MCG tablet.

## 2021-01-11 NOTE — Telephone Encounter (Signed)
Rx has been sent for 30 days until pt has appy on 10/18 pt's mother is aware.

## 2021-01-11 NOTE — Addendum Note (Signed)
Addended by: Nilda Riggs on: 01/11/2021 10:27 AM   Modules accepted: Orders

## 2021-01-29 NOTE — Progress Notes (Signed)
Chief Complaint  Patient presents with   Annual Exam     HPI: Patient  ENGLISH CRAIGHEAD  35 y.o. comes in today for Preventive Health Care visit  Here with mom reportedly doing well no further weight loss has been evaluated x2 for elevated IgA and she consulted with down specialist at [facility] who felt that general inflammation elevation was consistent with his downs and no further work-up is needed. He continues thyroid medicine every day with good compliance had neck ultrasound in 2017 that showed some tiny 2 mm or less nodule without enlarged thyroid.  Clinical follow-up consideration of repeat ultrasound.  Skin is controlled gets some keratotic lesions question hidradenitis Mom uses a topical to prevent boils and abscessesEmuad.   No concerns about vision hearing dental follow-up. To travel to [place] soon to visit family just up-to-date on the COVID booster.  Health Maintenance  Topic Date Due   HIV Screening  Never done   Hepatitis C Screening  Never done   TETANUS/TDAP  12/10/2028   INFLUENZA VACCINE  Completed   COVID-19 Vaccine  Completed   HPV VACCINES  Aged Out   Health Maintenance Review LIFESTYLE:  Exercise:   Tobacco/ETS: Alcohol:  Sugar beverages: Sleep: Drug use: no HH of  Work:5 d per week 9-3    ROS:  REST of 12 system review negative except as per HPI   Past Medical History:  Diagnosis Date   Hypothyroid    prev per Dr Tobe Sos    Personal history of spine surgery    Spondylisthesis    grade 2 corrected   Trisomy 21    Varicose veins    intervention 5 2011    Past Surgical History:  Procedure Laterality Date   ABLATION SAPHENOUS VEIN W/ RFA     SPINE SURGERY     WISDOM TOOTH EXTRACTION    Hx back surgery dr Ellene Route    Family History  Problem Relation Age of Onset   Melanoma Father     Social History   Socioeconomic History   Marital status: Single    Spouse name: Not on file   Number of children: Not on file   Years of education: Not  on file   Highest education level: Not on file  Occupational History   Not on file  Tobacco Use   Smoking status: Never   Smokeless tobacco: Never  Vaping Use   Vaping Use: Never used  Substance and Sexual Activity   Alcohol use: No   Drug use: No   Sexual activity: Not on file  Other Topics Concern   Not on file  Social History Narrative   Legal guardian: Linwood Gullikson   Sleep doing well   HH of 3   2 dogs   Swimming, drawing and singing   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 0.3 mL 0   Multiple Vitamins-Minerals (  MULTIVITAMIN ADULT) CHEW Chew 1 tablet by mouth daily.     SYNTHROID 25 MCG tablet TAKE 1 & 1/2 TABLETS BY MOUTH EACH DAY 45 tablet 0   valACYclovir (VALTREX) 1000 MG tablet TAKE 2 TABLETS BY MOUTH TWICE DAILY FOR COLD SORES 30 tablet PRN   No facility-administered medications prior to visit.     EXAM:  BP 110/60 (BP Location: Left Arm, Patient Position: Sitting, Cuff Size: Normal)   Pulse (!) 57   Temp 97.6 F (36.4 C) (Oral)   Ht 5\' 4"  (1.626 m)   Wt 121 lb 9.6 oz (55.2 kg)   SpO2 98%   BMI 20.87 kg/m   Body mass index is 20.87 kg/m. Wt Readings from Last 3 Encounters:  01/30/21 121 lb 9.6 oz (55.2 kg)  01/11/20 128 lb 4.8 oz (58.2 kg)  12/14/19 113 lb 12.8 oz (51.6 kg)    Physical Exam: Vital signs reviewed XTG:GYIR is a pleasant  well-nourished alert cooperative    with downs habitus in no acute distress.  Non verbal but  interactive and pleasant  cooperative   HEENT: normocephalic atraumatic , Eyes: PERRL EOM's full, conjunctiva clear,glasses  , Ears: no deformity EAC's clear TMs with normal landmarks. Mouth:masked  NECK: supple without masses, thyromegaly no nodules felt or bruits. CHEST/PULM:  Clear to auscultation and percussion breath sounds equal no wheeze , rales or rhonchi. No chest wall deformities or tenderness.. CV: PMI is nondisplaced, S1 S2 no gallops, murmurs, rubs. Peripheral pulses are full without delay.  ABDOMEN: Bowel sounds normal nontender  No guard or rebound, no hepato splenomegal no CVA tenderness.  No hernia.   Extremtities:  No clubbing cyanosis or edema, no acute joint swelling or redness no focal  swelling or lesion   downs habitus well healed  low bacjk scar NEURO:   cranial nerves 3-12 appear to be intact, no obvious focal weakness,gait steady  SKIN: No acute rashes normal turgor, color, no bruising or petechiae. Dry  follicular   upper thighs  no pustules  LN: no cervical axillary inguinal adenopathy  Lab Results  Component Value Date   WBC 4.1 12/14/2019   HGB 15.6 12/14/2019   HCT 44.8 12/14/2019   PLT 235 12/14/2019   GLUCOSE 88 12/14/2019   CHOL 221 (H) 12/14/2019   TRIG 113 12/14/2019   HDL 45 12/14/2019   LDLDIRECT 160.4 07/03/2012   LDLCALC 153 (H) 12/14/2019   ALT 14 12/14/2019   AST 16 12/14/2019   NA 138 12/14/2019   K 4.6 12/14/2019   CL 104 12/14/2019   CREATININE 0.82 12/14/2019   BUN 18 12/14/2019   CO2 24 12/14/2019   TSH 2.04 12/14/2019   MICROALBUR 0.9 12/22/2019    BP Readings from Last 3 Encounters:  01/30/21 110/60  01/11/20 107/80  12/14/19 116/72   Lab Results  Component Value Date   VITAMINB12 330 10/26/2018    Lab plan reviewed with patient   ASSESSMENT AND PLAN:  Discussed the following assessment and plan:    ICD-10-CM   1. Visit for preventive health examination  S85.46 Basic metabolic panel    CBC with Differential/Platelet    Hemoglobin A1c     Hepatic function panel    Lipid panel    TSH    T4, free    Hepatitis C antibody    Hepatitis C antibody    T4, free    TSH    Lipid panel    Hepatic function panel    Hemoglobin  A1c    CBC with Differential/Platelet    Basic metabolic panel    2. Hypothyroidism, unspecified type  R48.5 Basic metabolic panel    CBC with Differential/Platelet    Hemoglobin A1c    Hepatic function panel    Lipid panel    TSH    T4, free    US THYROID    T4, free    TSH    Lipid panel    Hepatic function panel    Hemoglobin A1c    CBC with Differential/Platelet    Basic metabolic panel    3. Elevated MCV  I62.7 Basic metabolic panel    CBC with Differential/Platelet    Hemoglobin A1c    Hepatic function panel    Lipid panel    TSH    T4, free    T4, free    TSH    Lipid panel    Hepatic function panel    Hemoglobin A1c    CBC with Differential/Platelet    Basic metabolic panel    4. Trisomy 21  O35.0 Basic metabolic panel    CBC with Differential/Platelet    Hemoglobin A1c    Hepatic function panel    Lipid panel    TSH    T4, free    US THYROID    T4, free    TSH    Lipid panel    Hepatic function panel    Hemoglobin A1c    CBC with Differential/Platelet    Basic metabolic panel    5. Need for hepatitis C screening test  Z11.59 Hepatitis C antibody    Hepatitis C antibody    6. Multiple thyroid nodules  E04.2 US THYROID    Request  90 days  for thyroid med  when sent in . Follow  Will do a follow up thyroid US  to be sure no new findings  Continue skin care  Monitoring labs today  He is fasting Return in about 1 year (around 01/30/2022) for depending on results.  Patient Care Team: Nafisa Olds, Standley Brooking, MD as PCP - General Calvert Cantor, MD as Attending Physician (Ophthalmology) Danella Sensing, MD as Consulting Physician (Dermatology) Patient Instructions  Good to see you today .  Will notify you  of labs when available.  Will send in 90 days of thyroid  medication after lab back.  Will order  FU thyroid ultrasound   I do not feel  significant abnormality  today .  Continue skin hygiene.   If all ok then  yearly labs and visit .   Standley Brooking. Rondrick Barreira M.D.

## 2021-01-30 ENCOUNTER — Encounter: Payer: Self-pay | Admitting: Internal Medicine

## 2021-01-30 ENCOUNTER — Ambulatory Visit (INDEPENDENT_AMBULATORY_CARE_PROVIDER_SITE_OTHER): Payer: 59 | Admitting: Internal Medicine

## 2021-01-30 ENCOUNTER — Other Ambulatory Visit (HOSPITAL_COMMUNITY): Payer: Self-pay

## 2021-01-30 ENCOUNTER — Other Ambulatory Visit: Payer: Self-pay

## 2021-01-30 VITALS — BP 110/60 | HR 57 | Temp 97.6°F | Ht 64.0 in | Wt 121.6 lb

## 2021-01-30 DIAGNOSIS — Z1159 Encounter for screening for other viral diseases: Secondary | ICD-10-CM

## 2021-01-30 DIAGNOSIS — R718 Other abnormality of red blood cells: Secondary | ICD-10-CM

## 2021-01-30 DIAGNOSIS — Q909 Down syndrome, unspecified: Secondary | ICD-10-CM | POA: Diagnosis not present

## 2021-01-30 DIAGNOSIS — E039 Hypothyroidism, unspecified: Secondary | ICD-10-CM | POA: Diagnosis not present

## 2021-01-30 DIAGNOSIS — E042 Nontoxic multinodular goiter: Secondary | ICD-10-CM | POA: Diagnosis not present

## 2021-01-30 DIAGNOSIS — Z0000 Encounter for general adult medical examination without abnormal findings: Secondary | ICD-10-CM | POA: Diagnosis not present

## 2021-01-30 LAB — HEPATIC FUNCTION PANEL
ALT: 15 U/L (ref 0–53)
AST: 20 U/L (ref 0–37)
Albumin: 4.4 g/dL (ref 3.5–5.2)
Alkaline Phosphatase: 84 U/L (ref 39–117)
Bilirubin, Direct: 0.2 mg/dL (ref 0.0–0.3)
Total Bilirubin: 1.4 mg/dL — ABNORMAL HIGH (ref 0.2–1.2)
Total Protein: 7.4 g/dL (ref 6.0–8.3)

## 2021-01-30 LAB — BASIC METABOLIC PANEL
BUN: 12 mg/dL (ref 6–23)
CO2: 29 mEq/L (ref 19–32)
Calcium: 9.8 mg/dL (ref 8.4–10.5)
Chloride: 100 mEq/L (ref 96–112)
Creatinine, Ser: 0.72 mg/dL (ref 0.40–1.50)
GFR: 120.09 mL/min (ref >60.00)
Glucose, Bld: 90 mg/dL (ref 70–99)
Potassium: 4.5 mEq/L (ref 3.5–5.1)
Sodium: 137 mEq/L (ref 135–145)

## 2021-01-30 LAB — CBC WITH DIFFERENTIAL/PLATELET
Basophils Absolute: 0.1 10*3/uL (ref 0.0–0.1)
Basophils Relative: 2 % (ref 0.0–3.0)
Eosinophils Absolute: 0 10*3/uL (ref 0.0–0.7)
Eosinophils Relative: 0.5 % (ref 0.0–5.0)
HCT: 44.4 % (ref 39.0–52.0)
Hemoglobin: 15 g/dL (ref 13.0–17.0)
Lymphocytes Relative: 23.8 % (ref 12.0–46.0)
Lymphs Abs: 1.2 10*3/uL (ref 0.7–4.0)
MCHC: 33.7 g/dL (ref 30.0–36.0)
MCV: 102.7 fl — ABNORMAL HIGH (ref 78.0–100.0)
Monocytes Absolute: 0.5 10*3/uL (ref 0.1–1.0)
Monocytes Relative: 9.3 % (ref 3.0–12.0)
Neutro Abs: 3.1 10*3/uL (ref 1.4–7.7)
Neutrophils Relative %: 64.4 % (ref 43.0–77.0)
Platelets: 233 10*3/uL (ref 150.0–400.0)
RBC: 4.32 Mil/uL (ref 4.22–5.81)
RDW: 13.4 % (ref 11.5–15.5)
WBC: 4.9 10*3/uL (ref 4.0–10.5)

## 2021-01-30 LAB — HEMOGLOBIN A1C: Hgb A1c MFr Bld: 5.1 % (ref 4.6–6.5)

## 2021-01-30 LAB — LIPID PANEL
Cholesterol: 201 mg/dL — ABNORMAL HIGH (ref 0–200)
HDL: 42.6 mg/dL (ref >39.00)
LDL Cholesterol: 128 mg/dL — ABNORMAL HIGH (ref 0–99)
NonHDL: 158.56
Total CHOL/HDL Ratio: 5
Triglycerides: 154 mg/dL — ABNORMAL HIGH (ref 0.0–149.0)
VLDL: 30.8 mg/dL (ref 0.0–40.0)

## 2021-01-30 LAB — T4, FREE: Free T4: 0.72 ng/dL (ref 0.60–1.60)

## 2021-01-30 LAB — TSH: TSH: 2.03 u[IU]/mL (ref 0.35–5.50)

## 2021-01-30 MED ORDER — SYNTHROID 25 MCG PO TABS
ORAL_TABLET | ORAL | 3 refills | Status: DC
  Filled 2021-01-30: qty 135, fill #0
  Filled 2021-02-05: qty 135, 90d supply, fill #0
  Filled 2021-05-05: qty 135, 90d supply, fill #1
  Filled 2021-08-02: qty 135, 90d supply, fill #2
  Filled 2021-10-23: qty 135, 90d supply, fill #3

## 2021-01-30 NOTE — Patient Instructions (Addendum)
Good to see you today .  Will notify you  of labs when available.  Will send in 90 days of thyroid medication after lab back.  Will order  FU thyroid ultrasound   I do not feel  significant abnormality  today .  Continue skin hygiene.   If all ok then  yearly labs and visit .

## 2021-01-30 NOTE — Addendum Note (Signed)
Addended byShanon Ace K on: 01/30/2021 05:41 PM   Modules accepted: Orders

## 2021-01-31 ENCOUNTER — Other Ambulatory Visit (HOSPITAL_COMMUNITY): Payer: Self-pay

## 2021-01-31 LAB — HEPATITIS C ANTIBODY
Hepatitis C Ab: NONREACTIVE
SIGNAL TO CUT-OFF: 0.02 (ref <1.00)

## 2021-02-02 NOTE — Progress Notes (Signed)
Results stable or normal . Reassuring. I have sent in 1 year of med  in 90 days of thyroid.

## 2021-02-05 ENCOUNTER — Ambulatory Visit
Admission: RE | Admit: 2021-02-05 | Discharge: 2021-02-05 | Disposition: A | Discharge status: Home / Self Care | Payer: 59 | Source: Ambulatory Visit | Attending: Internal Medicine | Admitting: Internal Medicine

## 2021-02-05 ENCOUNTER — Other Ambulatory Visit (HOSPITAL_COMMUNITY): Payer: Self-pay

## 2021-02-05 DIAGNOSIS — E042 Nontoxic multinodular goiter: Secondary | ICD-10-CM

## 2021-02-05 DIAGNOSIS — E039 Hypothyroidism, unspecified: Secondary | ICD-10-CM

## 2021-02-05 DIAGNOSIS — Q909 Down syndrome, unspecified: Secondary | ICD-10-CM

## 2021-02-05 DIAGNOSIS — E041 Nontoxic single thyroid nodule: Secondary | ICD-10-CM | POA: Diagnosis not present

## 2021-02-05 NOTE — Progress Notes (Signed)
Thyroid ultrasound stable no concerning finding or significant nodule. Follow clinical exam.

## 2021-02-06 ENCOUNTER — Other Ambulatory Visit (HOSPITAL_COMMUNITY): Payer: Self-pay

## 2021-03-14 ENCOUNTER — Other Ambulatory Visit (HOSPITAL_COMMUNITY): Payer: Self-pay

## 2021-03-14 ENCOUNTER — Other Ambulatory Visit: Payer: Self-pay | Admitting: Internal Medicine

## 2021-03-14 MED ORDER — VALACYCLOVIR HCL 1 G PO TABS
ORAL_TABLET | ORAL | 99 refills | Status: AC
  Filled 2021-03-14: qty 30, 7d supply, fill #0
  Filled 2021-06-25: qty 30, 7d supply, fill #1
  Filled 2021-07-20: qty 30, 7d supply, fill #2

## 2021-05-07 ENCOUNTER — Other Ambulatory Visit (HOSPITAL_COMMUNITY): Payer: Self-pay

## 2021-05-08 ENCOUNTER — Other Ambulatory Visit (HOSPITAL_COMMUNITY): Payer: Self-pay

## 2021-06-13 ENCOUNTER — Other Ambulatory Visit (HOSPITAL_COMMUNITY): Payer: Self-pay

## 2021-06-25 ENCOUNTER — Other Ambulatory Visit (HOSPITAL_COMMUNITY): Payer: Self-pay

## 2021-06-26 ENCOUNTER — Other Ambulatory Visit (HOSPITAL_COMMUNITY): Payer: Self-pay

## 2021-07-20 ENCOUNTER — Other Ambulatory Visit (HOSPITAL_COMMUNITY): Payer: Self-pay

## 2021-08-02 ENCOUNTER — Other Ambulatory Visit (HOSPITAL_COMMUNITY): Payer: Self-pay

## 2021-08-03 ENCOUNTER — Other Ambulatory Visit (HOSPITAL_COMMUNITY): Payer: Self-pay

## 2021-10-23 ENCOUNTER — Other Ambulatory Visit (HOSPITAL_COMMUNITY): Payer: Self-pay

## 2022-01-18 IMAGING — US US THYROID
1 series · 14 of 25 positions shown · non-contrast
Comparison: 08/17/2015

CLINICAL DATA: Down syndrome

Follow-up thyroid ultrasound
2 mm nodules
EXAM:
THYROID ULTRASOUND
TECHNIQUE: Ultrasound examination of the thyroid gland and adjacent soft
tissues was performed.

[Series 1: us thyroid · 0.05mm/px · 14 of 33 slices shown]
[im 1/33]
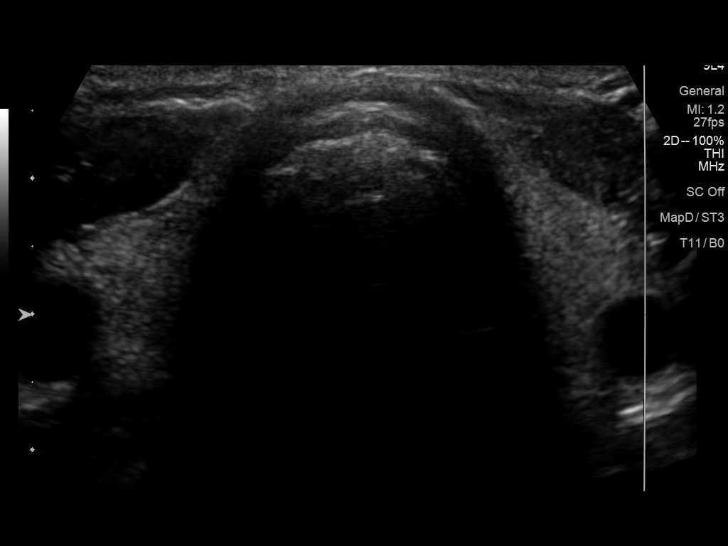
[im 3/33]
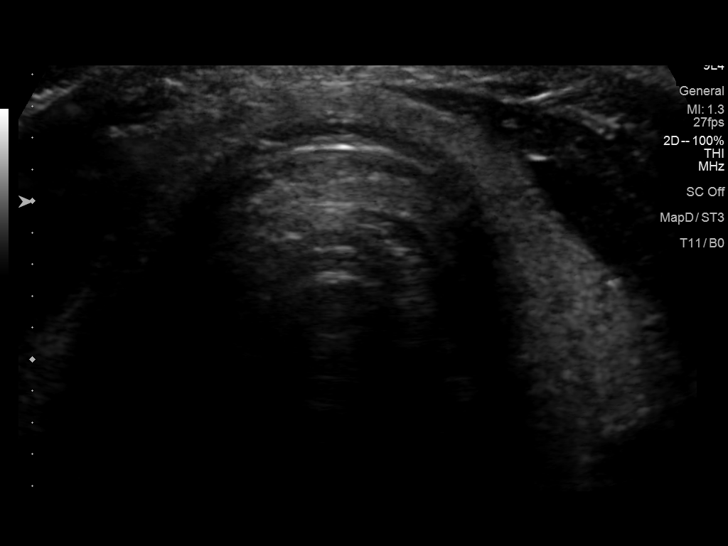
[im 6/33]
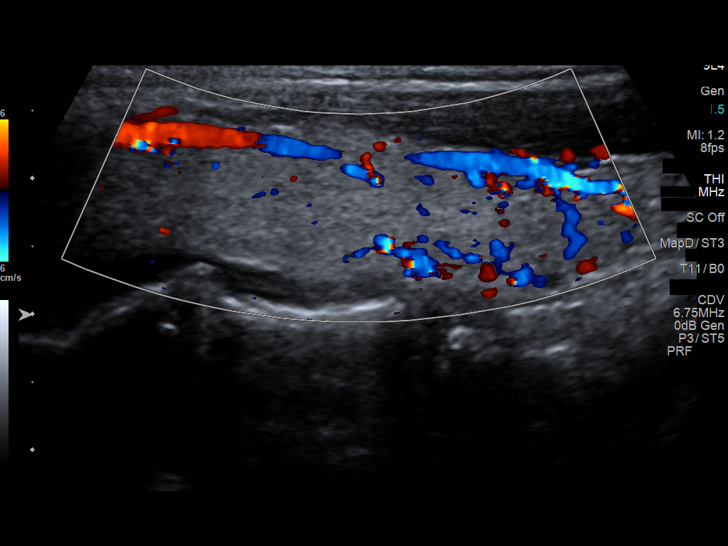
[im 9/33]
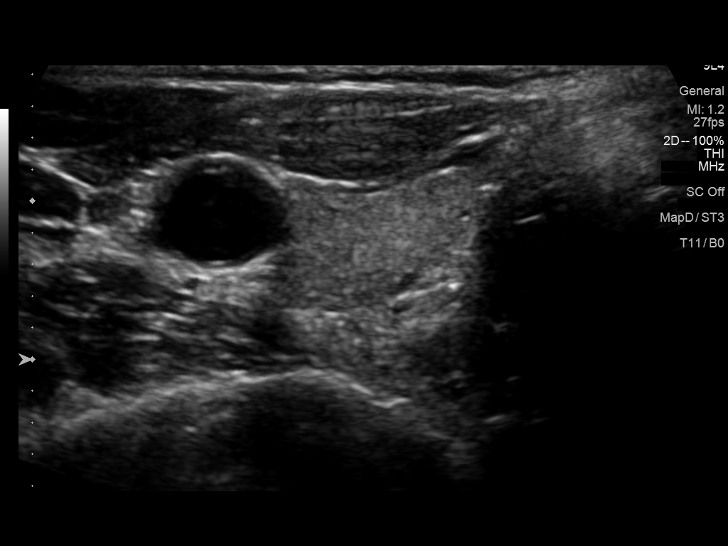
[im 11/33]
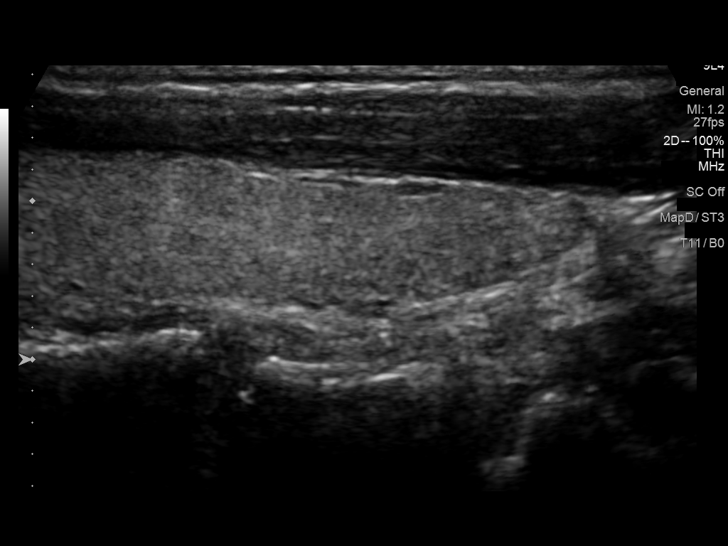
[im 13/33]
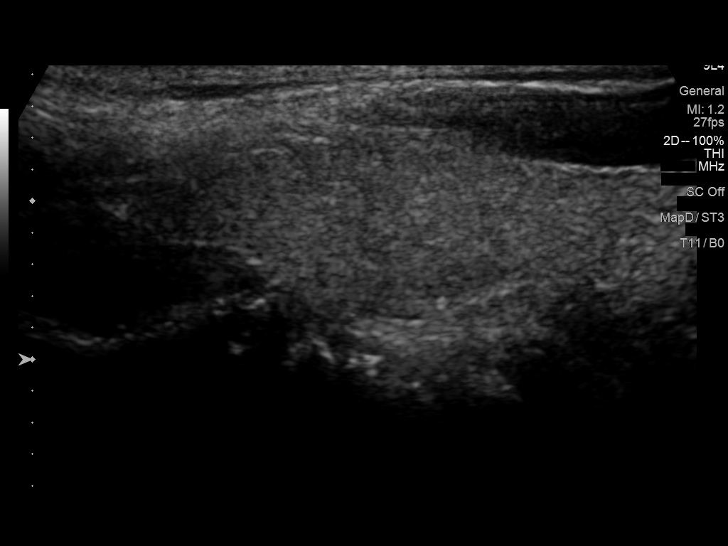
[im 15/33]
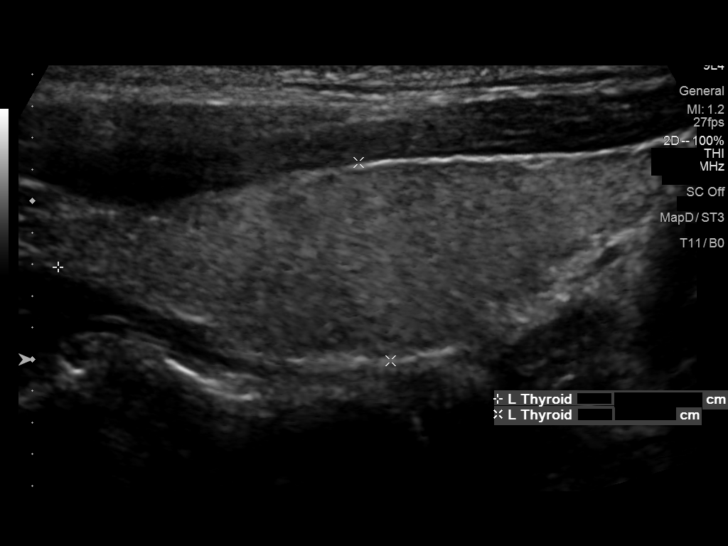
[im 18/33]
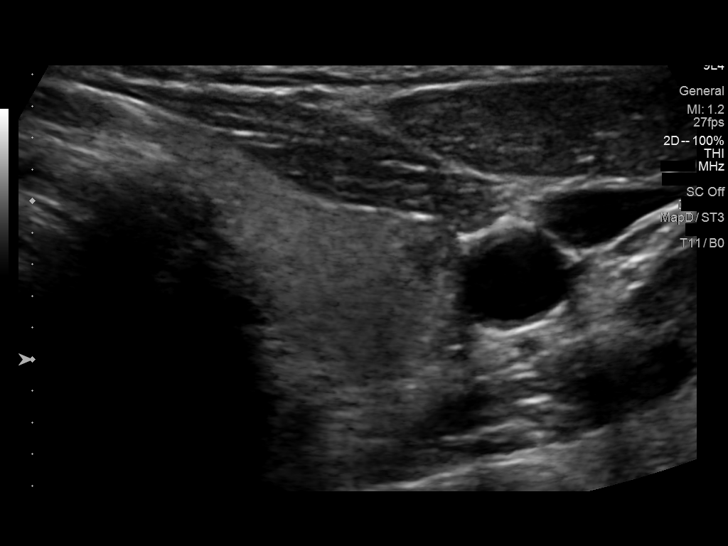
[im 21/33]
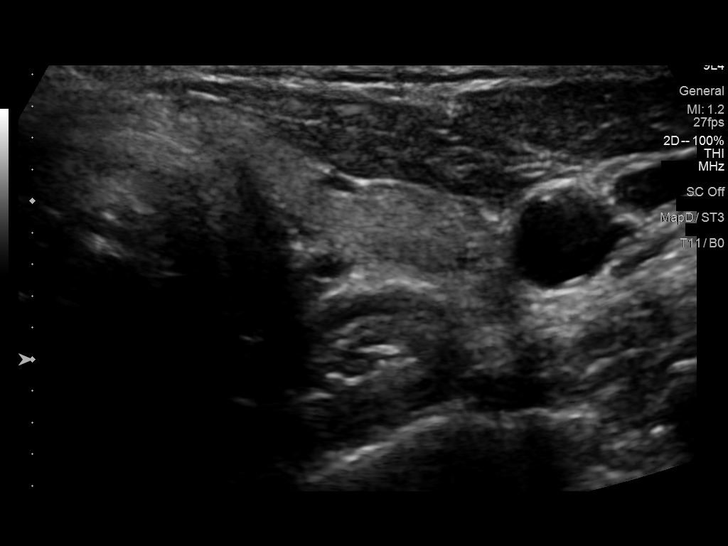
[im 22/33]
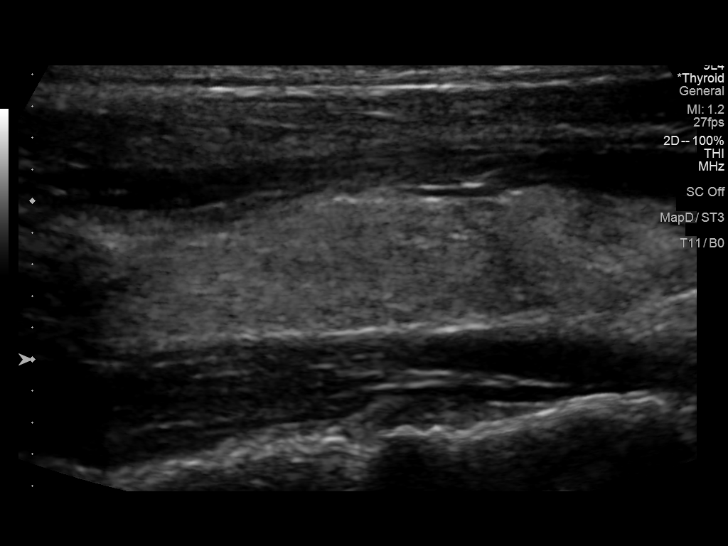
[im 25/33]
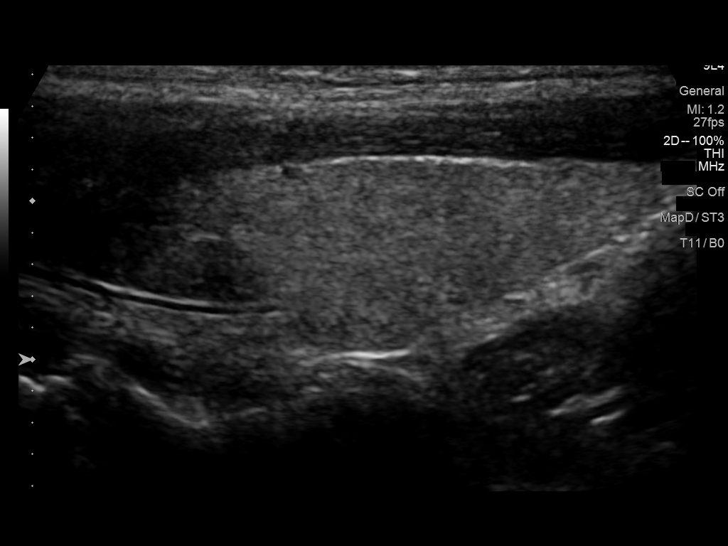
[im 27/33]
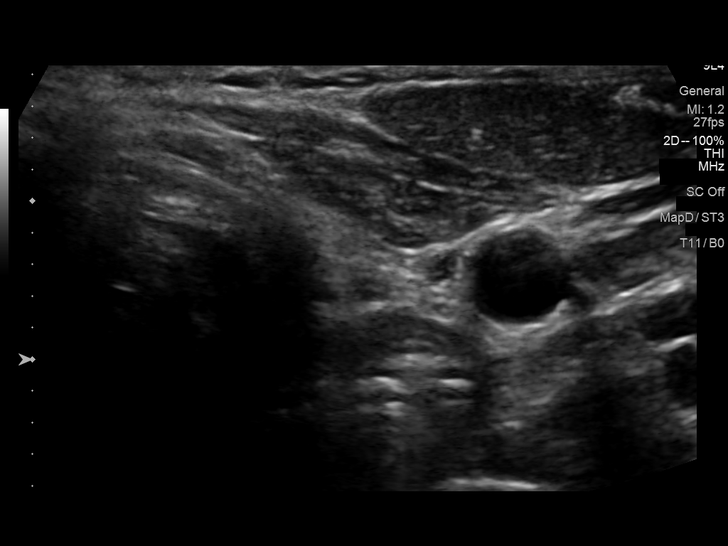
[im 30/33]
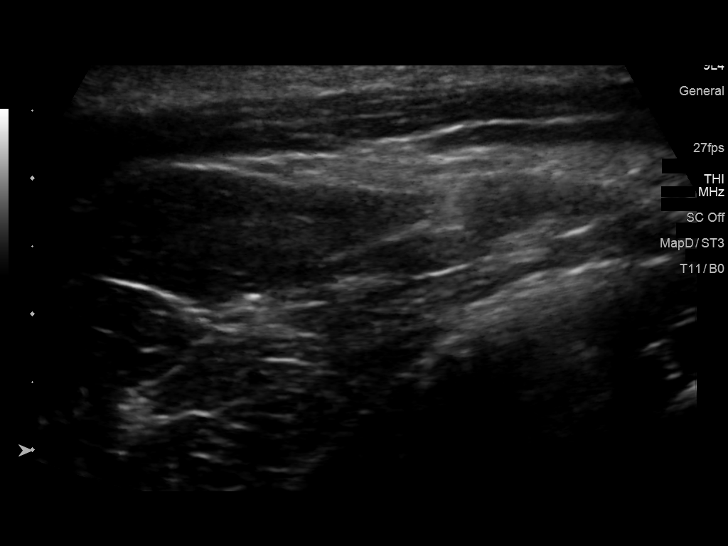
[im 33/33]
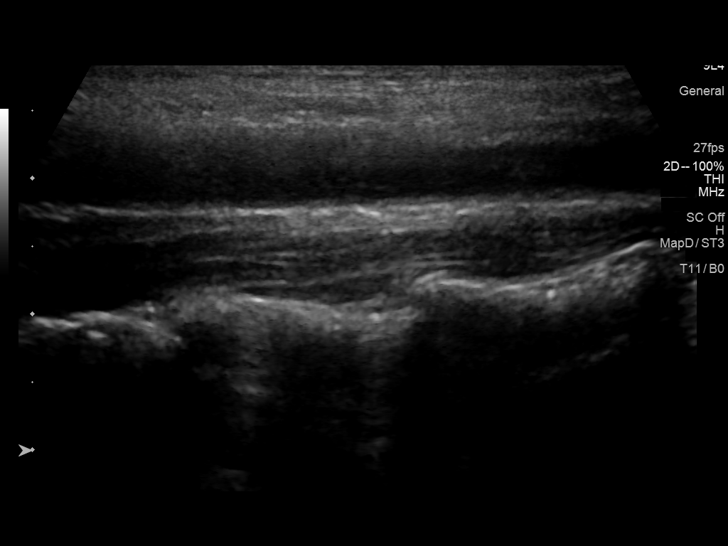

[14 of 25 positions shown; findings below may reference images not displayed]

FINDINGS: Parenchymal Echotexture: Normal

Isthmus: 0.2 cm

Right lobe: 4.7 x 1.0 x 1.5 cm

Left lobe: 3.9 x 1.3 x 1.5 cm

_________________________________________________________

Estimated total number of nodules >/= 1 cm: 0

Number of spongiform nodules >/=  2 cm not described below (TR1): 0

Number of mixed cystic and solid nodules >/= 1.5 cm not described
below (TR2): 0

_________________________________________________________

Hypoechoic left thyroid nodule measuring less than 3 mm does not
meet criteria for FNA or imaging surveillance.
IMPRESSION: Hypoechoic left thyroid nodule measuring less than 3 mm does not
meet criteria for FNA or imaging surveillance.

Thyroid otherwise normal in appearance.

The above is in keeping with the ACR TI-RADS recommendations - [HOSPITAL] 6428;[DATE].

## 2022-01-28 DIAGNOSIS — E038 Other specified hypothyroidism: Secondary | ICD-10-CM | POA: Diagnosis not present

## 2022-01-28 DIAGNOSIS — Q909 Down syndrome, unspecified: Secondary | ICD-10-CM | POA: Diagnosis not present

## 2022-01-28 DIAGNOSIS — Z23 Encounter for immunization: Secondary | ICD-10-CM | POA: Diagnosis not present

## 2022-01-28 DIAGNOSIS — Z808 Family history of malignant neoplasm of other organs or systems: Secondary | ICD-10-CM | POA: Diagnosis not present

## 2022-01-28 DIAGNOSIS — L739 Follicular disorder, unspecified: Secondary | ICD-10-CM | POA: Diagnosis not present

## 2022-01-28 DIAGNOSIS — B002 Herpesviral gingivostomatitis and pharyngotonsillitis: Secondary | ICD-10-CM | POA: Diagnosis not present

## 2022-01-28 DIAGNOSIS — R768 Other specified abnormal immunological findings in serum: Secondary | ICD-10-CM | POA: Diagnosis not present

## 2022-01-28 DIAGNOSIS — Z1339 Encounter for screening examination for other mental health and behavioral disorders: Secondary | ICD-10-CM | POA: Diagnosis not present

## 2022-01-28 DIAGNOSIS — Z0000 Encounter for general adult medical examination without abnormal findings: Secondary | ICD-10-CM | POA: Diagnosis not present

## 2022-02-01 DIAGNOSIS — R768 Other specified abnormal immunological findings in serum: Secondary | ICD-10-CM | POA: Diagnosis not present

## 2022-02-01 DIAGNOSIS — E063 Autoimmune thyroiditis: Secondary | ICD-10-CM | POA: Diagnosis not present

## 2022-02-01 DIAGNOSIS — Z0189 Encounter for other specified special examinations: Secondary | ICD-10-CM | POA: Diagnosis not present

## 2022-02-01 DIAGNOSIS — E038 Other specified hypothyroidism: Secondary | ICD-10-CM | POA: Diagnosis not present

## 2022-02-11 ENCOUNTER — Other Ambulatory Visit: Payer: Self-pay | Admitting: Internal Medicine

## 2022-02-13 ENCOUNTER — Telehealth: Payer: Self-pay | Admitting: Internal Medicine

## 2022-02-13 ENCOUNTER — Other Ambulatory Visit (HOSPITAL_COMMUNITY): Payer: Self-pay

## 2022-02-13 NOTE — Telephone Encounter (Signed)
Pt's mom called to request a refill of the SYNTHROID 25 MCG tablet (Expired)  Pt is completely out of this medication.  LOV:  01/30/22 = [place] Phone:  [phone]  Fax:  [phone]

## 2022-02-14 ENCOUNTER — Other Ambulatory Visit (HOSPITAL_COMMUNITY): Payer: Self-pay

## 2022-02-14 ENCOUNTER — Other Ambulatory Visit: Payer: Self-pay | Admitting: Internal Medicine

## 2022-02-15 ENCOUNTER — Other Ambulatory Visit: Payer: Self-pay

## 2022-02-15 ENCOUNTER — Other Ambulatory Visit (HOSPITAL_COMMUNITY): Payer: Self-pay

## 2022-02-15 ENCOUNTER — Other Ambulatory Visit: Payer: Self-pay | Admitting: Internal Medicine

## 2022-02-15 MED ORDER — SYNTHROID 25 MCG PO TABS
ORAL_TABLET | ORAL | 0 refills | Status: DC
  Filled 2022-02-15: qty 135, 30d supply, fill #0

## 2022-02-15 MED ORDER — SYNTHROID 25 MCG PO TABS
37.5000 ug | ORAL_TABLET | Freq: Every day | ORAL | 1 refills | Status: DC
  Filled 2022-02-15 – 2022-03-04 (×2): qty 135, 90d supply, fill #0
  Filled 2022-05-31: qty 135, 90d supply, fill #1

## 2022-02-15 NOTE — Telephone Encounter (Signed)
Spoke to mom. She reports she is currently getting treatment for leukemia. Patient been staying with her daughter who is a NP in [place]. Mom updates that patient already had his physical in [place] with daughter. Everything was WNL. She said she will have them sent the record to [place].   She wants to thank you Dr. Regis Bill for doing the refills for the patient.

## 2022-02-15 NOTE — Telephone Encounter (Signed)
Pt's mom called again to FU and really needs this refill for her son.   Pt is completely out of this medication.

## 2022-02-15 NOTE — Telephone Encounter (Signed)
Sorry  she is sick.    anything we can do to help. If tsh is ok we can keep refilling  as  indicated

## 2022-02-15 NOTE — Telephone Encounter (Signed)
I refilled x 1  I didn't get this request  unti this am  Due for yearly labs and visit before runs out of medication

## 2022-02-20 ENCOUNTER — Other Ambulatory Visit (HOSPITAL_COMMUNITY): Payer: Self-pay

## 2022-03-04 ENCOUNTER — Other Ambulatory Visit (HOSPITAL_COMMUNITY): Payer: Self-pay

## 2022-04-05 ENCOUNTER — Telehealth: Payer: Self-pay | Admitting: Internal Medicine

## 2022-04-05 NOTE — Telephone Encounter (Signed)
Pt mom dropped off forms to be signed by any Dr. She is aware that Dr. Regis Bill is out of the office. Pt mom is looking for this to be filled out at least by Tues. However she is aware it will take up to 5-7 business days.   Forms has been placed in folder.   Please advise.

## 2022-04-05 NOTE — Telephone Encounter (Signed)
I signed the form and it is on your desk

## 2022-04-05 NOTE — Telephone Encounter (Signed)
Mother notified that form was completed by Dr Sarajane Jews. She asks that it be emailed to the same email address given. Note closed.

## 2022-04-05 NOTE — Telephone Encounter (Signed)
Last OV 01/30/21  Mother called to notify her that pt will need to come in for an appt before form can be completed. Mother states that will be hard since she has leukemia & goes to [facility] frequently for treatment & son has been living in [place] with daughter for this reason. She states son is returning to [facility] in Jan, however form needs to be completed before then as he will need transportation Jan 4.  Advised the PCP is out of office indefinitely so this request will have to be sent to another provider who may or may not be comfortable completing form since not PCP. Mother states the purpose of the form is just to say that pt has Down's Syndrome. This RN states she will inform provider & let her know answer.  She states pt has been seen within last month by provider in [place]. Advised that person may be more appropriate to complete form. Form faxed to mom at [email or web address] for her to see if that provider can complete the form. States I will still send to another in office provider for review. Mom verb understanding.  Discussed situation with Dr Sarajane Jews; states he will review form. Form given to Dr Sarajane Jews.

## 2022-04-23 NOTE — Telephone Encounter (Signed)
Pt mom is calling and did not received page 2 of 3 pages and would like [employer] form to be email again mom Roy [email or web address] or fax to [place] attn courtney

## 2022-04-23 NOTE — Telephone Encounter (Signed)
Scanned to email provided due to needing applicant signature.

## 2022-06-03 ENCOUNTER — Other Ambulatory Visit (HOSPITAL_COMMUNITY): Payer: Self-pay

## 2022-06-04 ENCOUNTER — Other Ambulatory Visit (HOSPITAL_COMMUNITY): Payer: Self-pay

## 2022-06-04 ENCOUNTER — Other Ambulatory Visit: Payer: Self-pay

## 2022-06-04 ENCOUNTER — Encounter (HOSPITAL_COMMUNITY): Payer: Self-pay

## 2022-06-05 ENCOUNTER — Other Ambulatory Visit (HOSPITAL_BASED_OUTPATIENT_CLINIC_OR_DEPARTMENT_OTHER): Payer: Self-pay

## 2022-06-05 ENCOUNTER — Other Ambulatory Visit: Payer: Self-pay

## 2022-07-11 DIAGNOSIS — L659 Nonscarring hair loss, unspecified: Secondary | ICD-10-CM | POA: Diagnosis not present

## 2022-07-11 DIAGNOSIS — L639 Alopecia areata, unspecified: Secondary | ICD-10-CM | POA: Diagnosis not present

## 2022-08-27 ENCOUNTER — Other Ambulatory Visit: Payer: Self-pay | Admitting: Internal Medicine

## 2022-08-27 NOTE — Telephone Encounter (Signed)
Please refill 90 days worth

## 2022-08-28 ENCOUNTER — Other Ambulatory Visit (HOSPITAL_COMMUNITY): Payer: Self-pay

## 2022-08-28 ENCOUNTER — Encounter (HOSPITAL_COMMUNITY): Payer: Self-pay

## 2022-08-28 MED ORDER — SYNTHROID 25 MCG PO TABS
37.5000 ug | ORAL_TABLET | Freq: Every day | ORAL | 0 refills | Status: DC
  Filled 2022-08-28: qty 135, 90d supply, fill #0

## 2022-10-09 NOTE — Progress Notes (Signed)
No chief complaint on file.   HPI: Patient  Roy Carr  37 y.o. comes in today for Preventive Health Care visit   Health Maintenance  Topic Date Due   HIV Screening  Never done   COVID-19 Vaccine (6 - 2023-24 season) 12/14/2021   INFLUENZA VACCINE  11/14/2022   DTaP/Tdap/Td (3 - Td or Tdap) 12/10/2028   Hepatitis C Screening  Completed   HPV VACCINES  Aged Out   Health Maintenance Review LIFESTYLE:  Exercise:   Tobacco/ETS: Alcohol:  Sugar beverages: Sleep: Drug use: no HH of  Work:    ROS:  GEN/ HEENT: No fever, significant weight changes sweats headaches vision problems hearing changes, CV/ PULM; No chest pain shortness of breath cough, syncope,edema  change in exercise tolerance. GI /GU: No adominal pain, vomiting, change in bowel habits. No blood in the stool. No significant GU symptoms. SKIN/HEME: ,no acute skin rashes suspicious lesions or bleeding. No lymphadenopathy, nodules, masses.  NEURO/ PSYCH:  No neurologic signs such as weakness numbness. No depression anxiety. IMM/ Allergy: No unusual infections.  Allergy .   REST of 12 system review negative except as per HPI   Past Medical History:  Diagnosis Date   Hypothyroid    prev per Dr Fransico Michael    Personal history of spine surgery    Spondylisthesis    grade 2 corrected   Trisomy 21    Varicose veins    intervention 5 2011    Past Surgical History:  Procedure Laterality Date   ABLATION SAPHENOUS VEIN W/ RFA     SPINE SURGERY     WISDOM TOOTH EXTRACTION      Family History  Problem Relation Age of Onset   Melanoma Father     Social History   Socioeconomic History   Marital status: Single    Spouse name: Not on file   Number of children: Not on file   Years of education: Not on file   Highest education level: Not on file  Occupational History   Not on file  Tobacco Use   Smoking status: Never   Smokeless tobacco: Never  Vaping Use   Vaping Use: Never used  Substance and Sexual  Activity   Alcohol use: No   Drug use: No   Sexual activity: Not on file  Other Topics Concern   Not on file  Social History Narrative   Legal guardian: Roy Carr   Sleep doing well   HH of 3   2 dogs   Swimming, drawing and singing   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 0.3 mL 0   Multiple Vitamins-Minerals (MULTIVITAMIN ADULT) CHEW Chew 1 tablet by mouth daily.     SYNTHROID 25 MCG tablet Take 1 and 1/2 tablets (37.5 mcg total) by mouth daily. 135 tablet 0   valACYclovir (VALTREX) 1000 MG tablet TAKE 2 TABLETS BY MOUTH TWICE  DAILY FOR COLD SORES 30 tablet PRN   No facility-administered medications prior to visit.     EXAM:  There were no vitals taken for this visit.  There is no height or weight on file to calculate BMI. Wt Readings from Last 3 Encounters:  01/30/21 121 lb 9.6 oz (55.2 kg)  01/11/20 128 lb 4.8 oz (58.2 kg)  12/14/19 113 lb 12.8 oz (51.6 kg)    Physical Exam: Vital signs reviewed [id number] is a well-developed well-nourished alert cooperative    who appearsr stated age in no acute distress.  HEENT: normocephalic atraumatic , Eyes: PERRL EOM's full, conjunctiva clear, Nares: paten,t no deformity discharge or tenderness., Ears: no deformity EAC's clear TMs with normal landmarks. Mouth: clear OP, no lesions, edema.  Moist mucous membranes. Dentition in adequate repair. NECK: supple without masses, thyromegaly or bruits. CHEST/PULM:  Clear to auscultation and  percussion breath sounds equal no wheeze , rales or rhonchi. No chest wall deformities or tenderness. Breast: normal by inspection . No dimpling, discharge, masses, tenderness or discharge . CV: PMI is nondisplaced, S1 S2 no gallops, murmurs, rubs. Peripheral pulses are full without delay.No JVD .  ABDOMEN: Bowel sounds normal nontender  No guard or rebound, no hepato splenomegal no CVA tenderness.  No hernia. Extremtities:  No clubbing cyanosis or edema, no acute joint swelling or redness no focal atrophy NEURO:  Oriented x3, cranial nerves 3-12 appear to be intact, no obvious focal weakness,gait within normal limits no abnormal reflexes or asymmetrical SKIN: No acute rashes normal turgor, color, no bruising or petechiae. PSYCH: Oriented, good eye contact, no obvious depression anxiety, cognition and judgment appear normal. LN: no cervical axillary inguinal adenopathy  Lab Results  Component Value Date   WBC 4.9 01/30/2021   HGB 15.0 01/30/2021   HCT 44.4 01/30/2021   PLT 233.0 01/30/2021   GLUCOSE 90 01/30/2021   CHOL 201 (H) 01/30/2021   TRIG 154.0 (H) 01/30/2021   HDL 42.60 01/30/2021   LDLDIRECT 160.4 07/03/2012   LDLCALC 128 (H) 01/30/2021   ALT 15 01/30/2021   AST 20 01/30/2021   NA 137 01/30/2021   K 4.5 01/30/2021   CL 100 01/30/2021   CREATININE 0.72 01/30/2021   BUN 12 01/30/2021   CO2 29 01/30/2021   TSH 2.03 01/30/2021   HGBA1C 5.1 01/30/2021   MICROALBUR 0.9 12/22/2019    BP Readings from Last 3 Encounters:  01/30/21 110/60  01/11/20 107/80  12/14/19 116/72    Lab results reviewed with patient   ASSESSMENT AND PLAN:  Discussed the following assessment and plan:  No diagnosis found. No follow-ups on file.  Patient Care Team: Roy Carr, Roy Mends, MD as PCP - General Roy Chimes, MD as Attending Physician (Ophthalmology) Roy Resides, MD as Consulting Physician (Dermatology) There are no Patient Instructions on file for this visit.  Roy Carr. Roy Carr  M.D.

## 2022-10-10 ENCOUNTER — Encounter: Payer: Self-pay | Admitting: Internal Medicine

## 2022-10-10 ENCOUNTER — Ambulatory Visit (INDEPENDENT_AMBULATORY_CARE_PROVIDER_SITE_OTHER): Payer: Commercial Managed Care - PPO | Admitting: Internal Medicine

## 2022-10-10 VITALS — BP 104/76 | HR 56 | Temp 97.8°F | Ht 64.0 in | Wt 143.2 lb

## 2022-10-10 DIAGNOSIS — E786 Lipoprotein deficiency: Secondary | ICD-10-CM | POA: Diagnosis not present

## 2022-10-10 DIAGNOSIS — Z0000 Encounter for general adult medical examination without abnormal findings: Secondary | ICD-10-CM | POA: Diagnosis not present

## 2022-10-10 DIAGNOSIS — Q909 Down syndrome, unspecified: Secondary | ICD-10-CM | POA: Diagnosis not present

## 2022-10-10 DIAGNOSIS — E039 Hypothyroidism, unspecified: Secondary | ICD-10-CM | POA: Diagnosis not present

## 2022-10-10 DIAGNOSIS — R718 Other abnormality of red blood cells: Secondary | ICD-10-CM | POA: Diagnosis not present

## 2022-10-10 LAB — CBC WITH DIFFERENTIAL/PLATELET
Basophils Absolute: 0.1 10*3/uL (ref 0.0–0.1)
Basophils Relative: 2.1 % (ref 0.0–3.0)
Eosinophils Absolute: 0 10*3/uL (ref 0.0–0.7)
Eosinophils Relative: 0.7 % (ref 0.0–5.0)
HCT: 44.7 % (ref 39.0–52.0)
Hemoglobin: 15 g/dL (ref 13.0–17.0)
Lymphocytes Relative: 24.2 % (ref 12.0–46.0)
Lymphs Abs: 1.3 10*3/uL (ref 0.7–4.0)
MCHC: 33.6 g/dL (ref 30.0–36.0)
MCV: 102.1 fl — ABNORMAL HIGH (ref 78.0–100.0)
Monocytes Absolute: 0.5 10*3/uL (ref 0.1–1.0)
Monocytes Relative: 9.2 % (ref 3.0–12.0)
Neutro Abs: 3.3 10*3/uL (ref 1.4–7.7)
Neutrophils Relative %: 63.8 % (ref 43.0–77.0)
Platelets: 237 10*3/uL (ref 150.0–400.0)
RBC: 4.38 Mil/uL (ref 4.22–5.81)
RDW: 13.2 % (ref 11.5–15.5)
WBC: 5.2 10*3/uL (ref 4.0–10.5)

## 2022-10-10 LAB — LIPID PANEL
Cholesterol: 196 mg/dL (ref 0–200)
HDL: 35.1 mg/dL — ABNORMAL LOW (ref >39.00)
LDL Cholesterol: 127 mg/dL — ABNORMAL HIGH (ref 0–99)
NonHDL: 161.36
Total CHOL/HDL Ratio: 6
Triglycerides: 170 mg/dL — ABNORMAL HIGH (ref 0.0–149.0)
VLDL: 34 mg/dL (ref 0.0–40.0)

## 2022-10-10 LAB — T4, FREE: Free T4: 0.72 ng/dL (ref 0.60–1.60)

## 2022-10-10 LAB — BASIC METABOLIC PANEL
BUN: 13 mg/dL (ref 6–23)
CO2: 30 mEq/L (ref 19–32)
Calcium: 9.4 mg/dL (ref 8.4–10.5)
Chloride: 102 mEq/L (ref 96–112)
Creatinine, Ser: 0.78 mg/dL (ref 0.40–1.50)
GFR: 115.84 mL/min (ref >60.00)
Glucose, Bld: 89 mg/dL (ref 70–99)
Potassium: 4.2 mEq/L (ref 3.5–5.1)
Sodium: 137 mEq/L (ref 135–145)

## 2022-10-10 LAB — HEPATIC FUNCTION PANEL
ALT: 17 U/L (ref 0–53)
AST: 19 U/L (ref 0–37)
Albumin: 3.9 g/dL (ref 3.5–5.2)
Alkaline Phosphatase: 80 U/L (ref 39–117)
Bilirubin, Direct: 0.1 mg/dL (ref 0.0–0.3)
Total Bilirubin: 1 mg/dL (ref 0.2–1.2)
Total Protein: 7 g/dL (ref 6.0–8.3)

## 2022-10-10 LAB — HEMOGLOBIN A1C: Hgb A1c MFr Bld: 5.3 % (ref 4.6–6.5)

## 2022-10-10 LAB — TSH: TSH: 2.52 u[IU]/mL (ref 0.35–5.50)

## 2022-10-10 NOTE — Patient Instructions (Addendum)
God to see you today  Update labs.  Exam is stable good

## 2022-10-14 NOTE — Progress Notes (Signed)
Thyroid is in range  continue  same dose( ok to refill x 1 year )  blood count the same cholesterol could be better  but expect to improve .  With better diet recently  Blood sugar is normal

## 2022-10-15 ENCOUNTER — Other Ambulatory Visit (HOSPITAL_COMMUNITY): Payer: Self-pay

## 2022-10-15 ENCOUNTER — Encounter: Payer: Self-pay | Admitting: Internal Medicine

## 2022-10-15 MED ORDER — SYNTHROID 25 MCG PO TABS
37.5000 ug | ORAL_TABLET | Freq: Every day | ORAL | 2 refills | Status: DC
  Filled 2022-10-15 – 2022-11-27 (×2): qty 135, 90d supply, fill #0
  Filled 2023-02-23: qty 135, 90d supply, fill #1
  Filled 2023-05-22: qty 135, 90d supply, fill #2

## 2022-10-15 NOTE — Telephone Encounter (Signed)
Refill for a years  90 days per disp

## 2022-11-05 DIAGNOSIS — H5213 Myopia, bilateral: Secondary | ICD-10-CM | POA: Diagnosis not present

## 2022-11-27 ENCOUNTER — Other Ambulatory Visit (HOSPITAL_COMMUNITY): Payer: Self-pay

## 2022-11-27 ENCOUNTER — Other Ambulatory Visit: Payer: Self-pay

## 2022-11-28 ENCOUNTER — Other Ambulatory Visit (HOSPITAL_COMMUNITY): Payer: Self-pay

## 2022-12-23 ENCOUNTER — Encounter: Payer: Self-pay | Admitting: Internal Medicine

## 2023-02-24 ENCOUNTER — Other Ambulatory Visit (HOSPITAL_COMMUNITY): Payer: Self-pay

## 2023-02-24 DIAGNOSIS — Q120 Congenital cataract: Secondary | ICD-10-CM | POA: Diagnosis not present

## 2023-02-26 ENCOUNTER — Encounter: Payer: Self-pay | Admitting: Internal Medicine

## 2023-02-26 DIAGNOSIS — Q909 Down syndrome, unspecified: Secondary | ICD-10-CM

## 2023-02-27 NOTE — Telephone Encounter (Signed)
Dx trisomy 27

## 2023-02-27 NOTE — Telephone Encounter (Signed)
Yes   send order for speech therapy   as requested

## 2023-03-18 ENCOUNTER — Encounter: Payer: Commercial Managed Care - PPO | Admitting: Speech Pathology

## 2023-03-18 NOTE — Therapy (Signed)
OUTPATIENT SPEECH LANGUAGE PATHOLOGY EVALUATION   Patient Name: Roy Carr MRN: [id number] DOB:19-Sep-1985, 37 y.o., male Today's Date: 03/21/2023  PCP: Madelin Headings, MD  REFERRING PROVIDER: Madelin Headings, MD  END OF SESSION:  End of Session - 03/21/23 1100     Visit Number 1    Number of Visits 17    Date for SLP Re-Evaluation 06/13/23    SLP Start Time 0934    SLP Stop Time  1015    SLP Time Calculation (min) 41 min    Activity Tolerance Patient tolerated treatment well             Past Medical History:  Diagnosis Date   Hypothyroid    prev per Dr Fransico Michael    Personal history of spine surgery    Spondylisthesis    grade 2 corrected   Trisomy 21    Varicose veins    intervention 5 2011   Past Surgical History:  Procedure Laterality Date   ABLATION SAPHENOUS VEIN W/ RFA     SPINE SURGERY     WISDOM TOOTH EXTRACTION     Patient Active Problem List   Diagnosis Date Noted   Elevated MCV 08/18/2017   Rash and nonspecific skin eruption 07/08/2013   Low HDL (under 40) 07/08/2013   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 Stopped ST at age of 37. Mother reports lately pt has increased motivation for correct speech errors.  Pt accompanied by:  Mother   PERTINENT HISTORY: Trisomy 35, previously received extensive ST to address communication, has not in many years  PAIN:  Are  you having pain? No  FALLS: Has patient fallen in last 6 months?  No  LIVING ENVIRONMENT: Lives with: lives with their family Lives in: House/apartment  PLOF:  Level of assistance: Needed assistance with ADLs, Needed assistance with IADLS Employment: Full-time employment  PATIENT GOALS: to see if speech would be beneficial at this time to increase intelligibility   OBJECTIVE:  Note: Objective measures were completed at Evaluation unless otherwise noted.  COGNITION: Overall cognitive status: History of cognitive impairments - at baseline   MOTOR SPEECH: assessed across variety of speech tasks: reading, word repetition, generative discourse sample Overall motor speech: impaired Level of impairment: Word Rate of Speech: Increased Dysfluencies: stutter like dysfluencies (sound/syllable repetitions) - at onset of word, appears related to rate of speech Phonation: normal Voice Quality: normal Respiration: thoracic breathing Word and Phrasal Stress: WFL Resonance: WFL Articulation: Impaired: word Intelligibility: Intelligibility reduced ~75% to unfamiliar, trained listener  ARTICULATION Assessed all consonants at initial, medial, and final positions with occasional direct model required.   Errors noted: (produced sound/target sound) Initial position: v/t, f/th, s/z, w/l, w/r, w/y Medial position:  f/v, y/th, l/y Final position: f/th  Phonological processes evidenced: consonant cluster reduction, initial consonant deletion, final consonant deletion  PATIENT REPORTED OUTCOME MEASURES (PROM): Goal Attainment Scaling: Given a scale of 1-10, with 1 being  unable to communicate, 10 being ideal communication abilities, where is Roy Carr at now? 7 What characterizes a 7? Is communicating, is able to be understood by familiar listeners with context, challenges without context, strangers or community members unable to understand, mom has challenges understanding at times which causes  frustration, Roy Carr is trying to use clear speech and repeat when model is provided  What would an 8 look like? Improved ability to slow down, able to make small talk with community people, is practicing speech  TODAY'S TREATMENT:                                                                                                                                          03/21/23: Education provided on evaluation results and SLP's recommendations. Pt and caregiver verbalizes agreement with POC, all questions answered to satisfaction.    PATIENT EDUCATION: Education details: see above Person educated: Patient and Parent Education method: Medical illustrator Education comprehension: verbalized understanding, returned demonstration, verbal cues required, and needs further education  HOME EXERCISE PROGRAM: To be established initial therapy session   GOALS: Goals reviewed with patient? Yes  SHORT TERM GOALS: Target date: 05/02/2023  Pt will utilize strategies to slow rate of speech with usual mod-A at sentence level  Baseline: variable rate, increased rate decreases intelligibility.  Goal status: INITIAL  2.  Pt will produce consonant clusters with 50% accuracy at word level during structured practice  Baseline: 25% accuracy Goal status: INITIAL  3.  Pt will produce t, th, y in all positions with 50% accuracy at word level during structured practice  Baseline: 25% accuracy, is stimulible for targeted speech sounds  Goal status: INITIAL  4.  Pt will complete HEP BID 6/7 days week Baseline: not established  Goal status: INITIAL   LONG TERM GOALS: Target date: 06/13/2023  Mom will rate pt's speech as 8/10 via goal attainment scaling Baseline: 7, see above Goal status: INITIAL  2.  Pt will correct error'd sounds in 50% of opportunities at phrase level during structured practice  Baseline: attempts correction with support Goal status: INITIAL  3.  Pt will be 80% intelligible  in question responses Baseline: 75% intelligible Goal status: INITIAL   ASSESSMENT:  CLINICAL IMPRESSION: Patient is a 37 y.o. M who was seen today for motor speech evaluation. 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 Pt's speech is c/b articulation errors (see assessment section) and some phonological processes. Additionally, increased rate negatively impacts the clarity of intended message. Pt's mother reports increased motivation for working towards and correction of errors, feels more so than when previously attended ST. Pt does appear stimulible with mod-A, direct model and articulation cues for correction of some errors this date at word level. Pt would benefit from skilled ST to address aforementioned deficits to enhance communication efficacy.  OBJECTIVE IMPAIRMENTS: Objective impairments include  articulation disorder . These impairments are limiting patient from effectively communicating at home and in community.Factors affecting potential to achieve goals and functional outcome are previous level of function.. Patient will benefit from skilled SLP services to address above impairments and improve overall function.  REHAB POTENTIAL: Good  PLAN:  SLP FREQUENCY: 1-2x/week  SLP DURATION: 12 weeks  PLANNED INTERVENTIONS: 92522- Speech Eval Sound Prod, Articulate, Phonological, 16109 Treatment of speech (30 or 45 min) , Multimodal communication approach, SLP instruction and feedback, Compensatory strategies, and Patient/family education    Maia Breslow, CCC-SLP 03/21/2023, 11:30 AM  Check all possible CPT codes: See Planned Interventions List for Planned CPT Codes    Check all conditions that are expected to impact treatment: Cognitive Impairment or Intellectual disability   If treatment provided at initial evaluation, no treatment charged due to lack of  authorization.

## 2023-03-21 ENCOUNTER — Ambulatory Visit: Payer: Commercial Managed Care - PPO | Attending: Internal Medicine | Admitting: Speech Pathology

## 2023-03-21 ENCOUNTER — Encounter: Payer: Self-pay | Admitting: Speech Pathology

## 2023-03-21 DIAGNOSIS — Q909 Down syndrome, unspecified: Secondary | ICD-10-CM | POA: Insufficient documentation

## 2023-03-21 DIAGNOSIS — F800 Phonological disorder: Secondary | ICD-10-CM | POA: Insufficient documentation

## 2023-04-04 ENCOUNTER — Ambulatory Visit: Payer: Commercial Managed Care - PPO | Admitting: Speech Pathology

## 2023-04-04 DIAGNOSIS — F800 Phonological disorder: Secondary | ICD-10-CM

## 2023-04-04 DIAGNOSIS — Q909 Down syndrome, unspecified: Secondary | ICD-10-CM | POA: Diagnosis not present

## 2023-04-04 NOTE — Therapy (Signed)
OUTPATIENT SPEECH LANGUAGE PATHOLOGY TREATMENT NOTE   Patient Name: Roy Carr MRN: [id number] DOB:08/18/1985, 37 y.o., male Today's Date: 04/04/2023  PCP: Madelin Headings, MD  REFERRING PROVIDER: Madelin Headings, MD  END OF SESSION:  End of Session - 04/04/23 1010     Visit Number 2    Number of Visits 17    Date for SLP Re-Evaluation 06/13/23    SLP Start Time 0837    SLP Stop Time  0925    SLP Time Calculation (min) 48 min    Activity Tolerance Patient tolerated treatment well             Past Medical History:  Diagnosis Date   Hypothyroid    prev per Dr Fransico Michael    Personal history of spine surgery    Spondylisthesis    grade 2 corrected   Trisomy 21    Varicose veins    intervention 5 2011   Past Surgical History:  Procedure Laterality Date   ABLATION SAPHENOUS VEIN W/ RFA     SPINE SURGERY     WISDOM TOOTH EXTRACTION     Patient Active Problem List   Diagnosis Date Noted   Elevated MCV 08/18/2017   Rash and nonspecific skin eruption 07/08/2013   Low HDL (under 40) 07/08/2013   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 Stopped ST at age of 59. Mother reports lately pt has increased motivation for correct speech errors.  Pt accompanied by:  Mother   PERTINENT HISTORY: Trisomy 42, previously received extensive ST to address communication, has not in many years  PAIN:   Are you having pain? No  FALLS: Has patient fallen in last 6 months?  No  LIVING ENVIRONMENT: Lives with: lives with their family Lives in: House/apartment  PLOF:  Level of assistance: Needed assistance with ADLs, Needed assistance with IADLS Employment: Full-time employment  PATIENT GOALS: to see if speech would be beneficial at this time to increase intelligibility   OBJECTIVE:  Note: Objective measures were completed at Evaluation unless otherwise noted.  PATIENT REPORTED OUTCOME MEASURES (PROM): Goal Attainment Scaling: Given a scale of 1-10, with 1 being unable to communicate, 10 being ideal communication abilities, where is [name] at now? 7 What characterizes a 7? Is communicating, is able to be understood by familiar listeners with context, challenges without context, strangers or community members unable to understand, mom has challenges understanding at times which causes frustration, Roy Carr is trying to use clear speech and repeat when model is provided  What would an 8 look like? Improved ability to slow down, able to make small talk with community people, is practicing speech  TODAY'S TREATMENT:  04/04/23: Addressed articulation via mass practice trials for target sound /l/ in all positions. Pt benefited from visual feedback via mirror, verbal cues for tongue elevation, direct model. Pt achieved 75% accuracy with initial position, 60% accuracy medial position. Typical production without cues is w/l. Target rate reduction utilizing visual and tactile cues via pacing board and provision of direct model. Pt able to improve intelligibility at phrase level during structured practice 50%. HEP provided.   03/21/23: Education provided on evaluation results and SLP's recommendations. Pt and caregiver verbalizes agreement with POC, all questions answered  to satisfaction.    PATIENT EDUCATION: Education details: see above Person educated: Patient and Parent Education method: Medical illustrator Education comprehension: verbalized understanding, returned demonstration, verbal cues required, and needs further education  HOME EXERCISE PROGRAM: To be established initial therapy session   GOALS: Goals reviewed with patient? Yes  SHORT TERM GOALS: Target date: 05/02/2023  Pt will utilize strategies to slow rate of speech with usual mod-A at sentence level  Baseline: variable rate, increased rate decreases intelligibility.  Goal status: INITIAL  2.  Pt will produce consonant clusters with 50% accuracy at word level during structured practice  Baseline: 25% accuracy Goal status: INITIAL  3.  Pt will produce t, th, y in all positions with 50% accuracy at word level during structured practice  Baseline: 25% accuracy, is stimulible for targeted speech sounds  Goal status: INITIAL  4.  Pt will complete HEP BID 6/7 days week Baseline: not established  Goal status: INITIAL   LONG TERM GOALS: Target date: 06/13/2023  Mom will rate pt's speech as 8/10 via goal attainment scaling Baseline: 7, see above Goal status: INITIAL  2.  Pt will correct error'd sounds in 50% of opportunities at phrase level during structured practice  Baseline: attempts correction with support Goal status: INITIAL  3.  Pt will be 80% intelligible in question responses Baseline: 75% intelligible Goal status: INITIAL   ASSESSMENT:  CLINICAL IMPRESSION: Patient is a 37 y.o. M who was seen today for motor speech evaluation. 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 Pt's speech is c/b articulation errors (see assessment section) and some phonological processes. Additionally, increased rate negatively impacts the clarity of intended message. Pt's  mother reports increased motivation for working towards and correction of errors, feels more so than when previously attended ST. Pt does appear stimulible with mod-A, direct model and articulation cues for correction of some errors this date at word level. Pt would benefit from skilled ST to address aforementioned deficits to enhance communication efficacy.    OBJECTIVE IMPAIRMENTS: Objective impairments include  articulation disorder . These impairments are limiting patient from effectively communicating at home and in community.Factors affecting potential to achieve goals and functional outcome are previous level of function.. Patient will benefit from skilled SLP services to address above impairments and improve overall function.  REHAB POTENTIAL: Good  PLAN:  SLP FREQUENCY: 1-2x/week  SLP DURATION: 12 weeks  PLANNED INTERVENTIONS: 92522- Speech Eval Sound Prod, Articulate, Phonological, 32440 Treatment of speech (30 or 45 min) , Multimodal communication approach, SLP instruction and feedback, Compensatory strategies, and Patient/family education    Maia Breslow, CCC-SLP 04/04/2023, 10:11 AM  Check all possible CPT codes: See Planned Interventions List for Planned CPT Codes    Check all conditions that are expected to impact treatment: Cognitive Impairment or Intellectual disability   If treatment provided at initial evaluation,  no treatment charged due to lack of authorization.

## 2023-04-15 ENCOUNTER — Ambulatory Visit: Payer: Commercial Managed Care - PPO | Admitting: Speech Pathology

## 2023-04-18 ENCOUNTER — Ambulatory Visit: Payer: Commercial Managed Care - PPO | Attending: Internal Medicine | Admitting: Speech Pathology

## 2023-04-18 DIAGNOSIS — F800 Phonological disorder: Secondary | ICD-10-CM | POA: Diagnosis not present

## 2023-04-18 NOTE — Therapy (Signed)
 OUTPATIENT SPEECH LANGUAGE PATHOLOGY TREATMENT NOTE   Patient Name: Roy Carr MRN: [id number] DOB:[birth year], 38 y.o., male Today's Date: 04/18/2023  PCP: Charlett Apolinar POUR, MD  REFERRING PROVIDER: Charlett Apolinar POUR, MD  END OF SESSION:  End of Session - 04/18/23 0843     Visit Number 3    Number of Visits 17    Date for SLP Re-Evaluation 06/13/23    SLP Start Time 0843    SLP Stop Time  0923    SLP Time Calculation (min) 40 min    Activity Tolerance Patient tolerated treatment well             Past Medical History:  Diagnosis Date   Hypothyroid    prev per Dr Hershal    Personal history of spine surgery    Spondylisthesis    grade 2 corrected   Trisomy 21    Varicose veins    intervention 5 2011   Past Surgical History:  Procedure Laterality Date   ABLATION SAPHENOUS VEIN W/ RFA     SPINE SURGERY     WISDOM TOOTH EXTRACTION     Patient Active Problem List   Diagnosis Date Noted   Elevated MCV 08/18/2017   Rash and nonspecific skin eruption 07/08/2013   Low HDL (under 40) 07/08/2013   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   Visit for preventive health examination 07/03/2012   Early cataracts, bilateral 07/03/2012   Personal history of spine surgery    Seborrheic dermatitis of scalp 06/23/2010   Trisomy 21    Spondylolisthesis    Hypothyroidism 06/16/2009   VARICOSE VEINS, LOWER EXTREMITIES 06/16/2009   XERODERMA 06/16/2009    ONSET DATE: referral 02/26/23  REFERRING DIAG: Q90.9 (ICD-10-CM) - Trisomy 21   THERAPY DIAG:  Articulation disorder  Rationale for Evaluation and Treatment: Habilitation  SUBJECTIVE:   SUBJECTIVE STATEMENT: Endorses ongoing speech practice with focus on slowing down and /l/ all word positions   PERTINENT HISTORY: Trisomy 21, previously received extensive ST to address communication, has not in many years  PAIN:  Are you having pain? No  FALLS: Has patient fallen in last 6 months?  No  LIVING ENVIRONMENT: Lives with:  lives with their family Lives in: House/apartment  PLOF:  Level of assistance: Needed assistance with ADLs, Needed assistance with IADLS Employment: Full-time employment  PATIENT GOALS: to see if speech would be beneficial at this time to increase intelligibility   OBJECTIVE:  Note: Objective measures were completed at Evaluation unless otherwise noted.  PATIENT REPORTED OUTCOME MEASURES (PROM): Goal Attainment Scaling: Given a scale of 1-10, with 1 being unable to communicate, 10 being ideal communication abilities, where is Roy Carr at now? 7 What characterizes a 7? Is communicating, is able to be understood by familiar listeners with context, challenges without context, strangers or community members unable to understand, mom has challenges understanding at times which causes frustration, Roy Carr is trying to use clear speech and repeat when model is provided  What would an 8 look like? Improved ability to slow down, able to make small talk with community people, is practicing speech  TODAY'S TREATMENT:  04/18/23: Addressed articulation in conversational speech with use of pacing aid PRN for multi-syllabic words for increased accuracy and intelligibility. Pt benefits from slowed rate to enhance communication partner understanding of message. Practiced airflow for /s/, tongue posture for /l/ with over accuracy ~50%. Pt benefits from choral production and visual cues for articulator placement. Plan to create speech book with personally relevant words and sentences regarding work, family, restaurants/food, and hobbies next session.   04/04/23: Addressed articulation via mass practice trials for target sound /l/ in all positions. Pt benefited from visual feedback via mirror, verbal cues for tongue elevation, direct model. Pt achieved 75% accuracy with initial  position, 60% accuracy medial position. Typical production without cues is w/l. Target rate reduction utilizing visual and tactile cues via pacing board and provision of direct model. Pt able to improve intelligibility at phrase level during structured practice 50%. HEP provided.   03/21/23: Education provided on evaluation results and SLP's recommendations. Pt and caregiver verbalizes agreement with POC, all questions answered to satisfaction.    PATIENT EDUCATION: Education details: see above Person educated: Patient and Parent Education method: Medical Illustrator Education comprehension: verbalized understanding, returned demonstration, verbal cues required, and needs further education  HOME EXERCISE PROGRAM: To be established initial therapy session   GOALS: Goals reviewed with patient? Yes  SHORT TERM GOALS: Target date: 05/02/2023  Pt will utilize strategies to slow rate of speech with usual mod-A at sentence level  Baseline: variable rate, increased rate decreases intelligibility.  Goal status: INITIAL  2.  Pt will produce consonant clusters with 50% accuracy at word level during structured practice  Baseline: 25% accuracy Goal status: INITIAL  3.  Pt will produce t, th, y in all positions with 50% accuracy at word level during structured practice  Baseline: 25% accuracy, is stimulible for targeted speech sounds  Goal status: INITIAL  4.  Pt will complete HEP BID 6/7 days week Baseline: not established  Goal status: INITIAL   LONG TERM GOALS: Target date: 06/13/2023  Mom will rate pt's speech as 8/10 via goal attainment scaling Baseline: 7, see above Goal status: INITIAL  2.  Pt will correct error'd sounds in 50% of opportunities at phrase level during structured practice  Baseline: attempts correction with support Goal status: INITIAL  3.  Pt will be 80% intelligible in question responses Baseline: 75% intelligible Goal status:  INITIAL   ASSESSMENT:  CLINICAL IMPRESSION: Patient is a 38 y.o. M who was seen today for motor speech evaluation. 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 Pt's speech is c/b articulation errors (see assessment section) and some phonological processes. Additionally, increased rate negatively impacts the clarity of intended message. Pt's mother reports increased motivation for working towards and correction of errors, feels more so than when previously attended ST. Pt does appear stimulible with mod-A, direct model and articulation cues for correction of some errors this date at word level. Pt would benefit from skilled ST to address aforementioned deficits to enhance communication efficacy.    OBJECTIVE IMPAIRMENTS: Objective impairments include  articulation disorder . These impairments are limiting patient from effectively communicating at home and in community.Factors affecting potential to achieve goals and functional outcome are previous level of function.. Patient will benefit from skilled SLP services to address above impairments and improve overall function.  REHAB POTENTIAL: Good  PLAN:  SLP FREQUENCY: 1-2x/week  SLP DURATION: 12 weeks  PLANNED INTERVENTIONS: 07477-  Speech Eval Sound Prod, Articulate, Phonological, 07492 Treatment of speech (30 or 45 min) , Multimodal communication approach, SLP instruction and feedback, Compensatory strategies, and Patient/family education    Harlene LITTIE Ned, CCC-SLP 04/18/2023, 8:44 AM  Check all possible CPT codes: See Planned Interventions List for Planned CPT Codes    Check all conditions that are expected to impact treatment: Cognitive Impairment or Intellectual disability   If treatment provided at initial evaluation, no treatment charged due to lack of authorization.

## 2023-04-22 ENCOUNTER — Ambulatory Visit: Payer: Commercial Managed Care - PPO | Admitting: Speech Pathology

## 2023-04-22 DIAGNOSIS — F800 Phonological disorder: Secondary | ICD-10-CM | POA: Diagnosis not present

## 2023-04-22 NOTE — Therapy (Signed)
 OUTPATIENT SPEECH LANGUAGE PATHOLOGY TREATMENT NOTE   Patient Name: Roy Carr MRN: [id number] DOB:Jun 05, 1985, 38 y.o., male Today's Date: 04/22/2023  PCP: Charlett Apolinar POUR, MD  REFERRING PROVIDER: Charlett Apolinar POUR, MD  END OF SESSION:  End of Session - 04/22/23 1614     Visit Number 4    Number of Visits 17    Date for SLP Re-Evaluation 06/13/23    SLP Start Time 1527    SLP Stop Time  1614    SLP Time Calculation (min) 47 min    Activity Tolerance Patient tolerated treatment well             Past Medical History:  Diagnosis Date   Hypothyroid    prev per Dr Hershal    Personal history of spine surgery    Spondylisthesis    grade 2 corrected   Trisomy 21    Varicose veins    intervention 5 2011   Past Surgical History:  Procedure Laterality Date   ABLATION SAPHENOUS VEIN W/ RFA     SPINE SURGERY     WISDOM TOOTH EXTRACTION     Patient Active Problem List   Diagnosis Date Noted   Elevated MCV 08/18/2017   Rash and nonspecific skin eruption 07/08/2013   Low HDL (under 40) 07/08/2013   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   Visit for preventive health examination 07/03/2012   Early cataracts, bilateral 07/03/2012   Personal history of spine surgery    Seborrheic dermatitis of scalp 06/23/2010   Trisomy 21    Spondylolisthesis    Hypothyroidism 06/16/2009   VARICOSE VEINS, LOWER EXTREMITIES 06/16/2009   XERODERMA 06/16/2009    ONSET DATE: referral 02/26/23  REFERRING DIAG: Q90.9 (ICD-10-CM) - Trisomy 21   THERAPY DIAG:  Articulation disorder  Rationale for Evaluation and Treatment: Habilitation  SUBJECTIVE:   SUBJECTIVE STATEMENT: Attends session with mother, presenting with photo book of last year to utilize as stimulus for speech practice. Pt highly engaged throughout session with personally relevant stimuli, highly receptive to modeling and shaping this session.   PERTINENT HISTORY: Trisomy 1, previously received extensive ST to address  communication, has not in many years  PAIN:  Are you having pain? No  FALLS: Has patient fallen in last 6 months?  No  LIVING ENVIRONMENT: Lives with: lives with their family Lives in: House/apartment  PLOF:  Level of assistance: Needed assistance with ADLs, Needed assistance with IADLS Employment: Full-time employment  PATIENT GOALS: to see if speech would be beneficial at this time to increase intelligibility   OBJECTIVE:  Note: Objective measures were completed at Evaluation unless otherwise noted.  PATIENT REPORTED OUTCOME MEASURES (PROM): Goal Attainment Scaling: Given a scale of 1-10, with 1 being unable to communicate, 10 being ideal communication abilities, where is Roy Carr at now? 7 What characterizes a 7? Is communicating, is able to be understood by familiar listeners with context, challenges without context, strangers or community members unable to understand, mom has challenges understanding at times which causes frustration, Roy Carr is trying to use clear speech and repeat when model is provided  What would an 8 look like? Improved ability to slow down, able to make small talk with community people, is practicing speech  TODAY'S TREATMENT:  04/22/23: Addressed articulation via photo description task. Pt with usual articulation errors with ability to correct highly salient words with close approximation in 80% of opportunities given max-A modeling, shaping, verbal cues. Increased awareness and independent attempts noted with high repetitions of words such as Roy Carr or Roy Carr. Pt benefits from informal structured with increased engagement and stimulibility noted.   04/18/23: Addressed articulation in conversational speech with use of pacing aid PRN for multi-syllabic words for increased accuracy and intelligibility. Pt benefits from slowed  rate to enhance communication partner understanding of message. Practiced airflow for /s/, tongue posture for /l/ with over accuracy ~50%. Pt benefits from choral production and visual cues for articulator placement. Plan to create speech book with personally relevant words and sentences regarding work, family, restaurants/food, and hobbies next session.   04/04/23: Addressed articulation via mass practice trials for target sound /l/ in all positions. Pt benefited from visual feedback via mirror, verbal cues for tongue elevation, direct model. Pt achieved 75% accuracy with initial position, 60% accuracy medial position. Typical production without cues is w/l. Target rate reduction utilizing visual and tactile cues via pacing board and provision of direct model. Pt able to improve intelligibility at phrase level during structured practice 50%. HEP provided.   03/21/23: Education provided on evaluation results and SLP's recommendations. Pt and caregiver verbalizes agreement with POC, all questions answered to satisfaction.    PATIENT EDUCATION: Education details: see above Person educated: Patient and Parent Education method: Medical Illustrator Education comprehension: verbalized understanding, returned demonstration, verbal cues required, and needs further education  HOME EXERCISE PROGRAM: To be established initial therapy session   GOALS: Goals reviewed with patient? Yes  SHORT TERM GOALS: Target date: 05/02/2023  Pt will utilize strategies to slow rate of speech with usual mod-A at sentence level  Baseline: variable rate, increased rate decreases intelligibility.  Goal status: INITIAL  2.  Pt will produce consonant clusters with 50% accuracy at word level during structured practice  Baseline: 25% accuracy Goal status: INITIAL  3.  Pt will produce t, th, y in all positions with 50% accuracy at word level during structured practice  Baseline: 25% accuracy, is stimulible for  targeted speech sounds  Goal status: INITIAL  4.  Pt will complete HEP BID 6/7 days week Baseline: not established  Goal status: INITIAL   LONG TERM GOALS: Target date: 06/13/2023  Mom will rate pt's speech as 8/10 via goal attainment scaling Baseline: 7, see above Goal status: INITIAL  2.  Pt will correct error'd sounds in 50% of opportunities at phrase level during structured practice  Baseline: attempts correction with support Goal status: INITIAL  3.  Pt will be 80% intelligible in question responses Baseline: 75% intelligible Goal status: INITIAL   ASSESSMENT:  CLINICAL IMPRESSION: Patient is a 37 y.o. M who was seen today for motor speech evaluation. 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 Pt's speech is c/b articulation errors (see assessment section) and some phonological processes. Additionally, increased rate negatively impacts the clarity of intended message. Pt's mother reports increased motivation for working towards and correction of errors, feels more so than when previously attended ST. Pt does appear stimulible with mod-A, direct model and articulation cues for correction of some errors this date at word level. Pt would benefit from skilled ST to address aforementioned deficits to enhance communication efficacy.    OBJECTIVE IMPAIRMENTS: Objective impairments include  articulation disorder .  These impairments are limiting patient from effectively communicating at home and in community.Factors affecting potential to achieve goals and functional outcome are previous level of function.. Patient will benefit from skilled SLP services to address above impairments and improve overall function.  REHAB POTENTIAL: Good  PLAN:  SLP FREQUENCY: 1-2x/week  SLP DURATION: 12 weeks  PLANNED INTERVENTIONS: 92522- Speech Eval Sound Prod, Articulate, Phonological,  07492 Treatment of speech (30 or 45 min) , Multimodal communication approach, SLP instruction and feedback, Compensatory strategies, and Patient/family education    Harlene LITTIE Ned, CCC-SLP 04/22/2023, 4:14 PM  Check all possible CPT codes: See Planned Interventions List for Planned CPT Codes    Check all conditions that are expected to impact treatment: Cognitive Impairment or Intellectual disability   If treatment provided at initial evaluation, no treatment charged due to lack of authorization.

## 2023-04-25 ENCOUNTER — Ambulatory Visit: Payer: Commercial Managed Care - PPO | Admitting: Speech Pathology

## 2023-05-01 ENCOUNTER — Ambulatory Visit: Payer: Commercial Managed Care - PPO | Admitting: Speech Pathology

## 2023-05-01 DIAGNOSIS — F800 Phonological disorder: Secondary | ICD-10-CM | POA: Diagnosis not present

## 2023-05-01 NOTE — Therapy (Signed)
OUTPATIENT SPEECH LANGUAGE PATHOLOGY TREATMENT NOTE   Patient Name: Roy Carr MRN: [id number] DOB:1985-09-10, 38 y.o., male Today's Date: 05/01/2023  PCP: Roy Headings, MD  REFERRING PROVIDER: Madelin Headings, MD  END OF SESSION:   End of Session - 05/02/23 0747     Visit Number 5    Number of Visits 17    Date for SLP Re-Evaluation 06/13/23    SLP Start Time 1445    SLP Stop Time  1530    SLP Time Calculation (min) 45 min    Activity Tolerance Patient tolerated treatment well             Past Medical History:  Diagnosis Date   Hypothyroid    prev per Roy Carr    Personal history of spine surgery    Spondylisthesis    grade 2 corrected   Trisomy 21    Varicose veins    intervention 5 2011   Past Surgical History:  Procedure Laterality Date   ABLATION SAPHENOUS VEIN W/ RFA     SPINE SURGERY     WISDOM TOOTH EXTRACTION     Patient Active Problem List   Diagnosis Date Noted   Elevated MCV 08/18/2017   Rash and nonspecific skin eruption 07/08/2013   Low HDL (under 40) 07/08/2013   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  Rationale for Evaluation and Treatment: Habilitation  SUBJECTIVE:   SUBJECTIVE STATEMENT: Attends session with mother, presenting with photo book of last year to utilize as stimulus for speech practice. Pt highly engaged throughout session with personally relevant stimuli, highly receptive to modeling and shaping this session.   PERTINENT HISTORY: Trisomy 34, previously received extensive ST to address  communication, has not in many years  PAIN:  Are you having pain? No  FALLS: Has patient fallen in last 6 months?  No  LIVING ENVIRONMENT: Lives with: lives with their family Lives in: House/apartment  PLOF:  Level of assistance: Needed assistance with ADLs, Needed assistance with IADLS Employment: Full-time employment  PATIENT GOALS: to see if speech would be beneficial at this time to increase intelligibility   OBJECTIVE:  Note: Objective measures were completed at Evaluation unless otherwise noted.  PATIENT REPORTED OUTCOME MEASURES (PROM): Goal Attainment Scaling: Given a scale of 1-10, with 1 being unable to communicate, 10 being ideal communication abilities, where is Roy Carr at now? 7 What characterizes a 7? Is communicating, is able to be understood by familiar listeners with context, challenges without context, strangers or community members unable to understand, mom has challenges understanding at times which causes frustration, Roy Carr is trying to use clear speech and repeat when model is provided  What would an 8 look like? Improved ability to slow down, able to make small talk with community people, is practicing speech  TODAY'S TREATMENT:  05/02/23: Roy Carr brings new speech book featuring family members and significant places to speech today. Continued to target improved intelligibility with focus on personally relevant high frequency words. Pt continues to benefit from articulation cues for improved accuracy of error'd productions, visual and verbal cues for reduced rate for improved accuracy of multisyllabic words and increased intelligibility in multi-word utterances.   04/22/23: Addressed articulation via photo description task. Pt with usual articulation errors with ability to correct highly salient words with close approximation in 80% of  opportunities given max-A modeling, shaping, verbal cues. Increased awareness and independent attempts noted with high repetitions of words such as "Roy Carr" or "Roy Carr." Pt benefits from informal structured with increased engagement and stimulibility noted.   04/18/23: Addressed articulation in conversational speech with use of pacing aid PRN for multi-syllabic words for increased accuracy and intelligibility. Pt benefits from slowed rate to enhance communication partner understanding of message. Practiced airflow for /s/, tongue posture for /l/ with over accuracy ~50%. Pt benefits from choral production and visual cues for articulator placement. Plan to create "speech book" with personally relevant words and sentences regarding work, family, restaurants/food, and hobbies next session.   04/04/23: Addressed articulation via mass practice trials for target sound /l/ in all positions. Pt benefited from visual feedback via mirror, verbal cues for tongue elevation, direct model. Pt achieved 75% accuracy with initial position, 60% accuracy medial position. Typical production without cues is w/l. Target rate reduction utilizing visual and tactile cues via pacing board and provision of direct model. Pt able to improve intelligibility at phrase level during structured practice 50%. HEP provided.   03/21/23: Education provided on evaluation results and SLP's recommendations. Pt and caregiver verbalizes agreement with POC, all questions answered to satisfaction.    PATIENT EDUCATION: Education details: see above Person educated: Patient and Parent Education method: Medical illustrator Education comprehension: verbalized understanding, returned demonstration, verbal cues required, and needs further education  HOME EXERCISE PROGRAM: To be established initial therapy session   GOALS: Goals reviewed with patient? Yes  SHORT TERM GOALS: Target date: 05/02/2023  Pt will utilize strategies to slow  rate of speech with usual mod-A at sentence level  Baseline: variable rate, increased rate decreases intelligibility.  Goal status: INITIAL  2.  Pt will produce consonant clusters with 50% accuracy at word level during structured practice  Baseline: 25% accuracy Goal status: INITIAL  3.  Pt will produce t, th, y in all positions with 50% accuracy at word level during structured practice  Baseline: 25% accuracy, is stimulible for targeted speech sounds  Goal status: INITIAL  4.  Pt will complete HEP BID 6/7 days week Baseline: not established  Goal status: INITIAL   LONG TERM GOALS: Target date: 06/13/2023  Mom will rate pt's speech as 8/10 via goal attainment scaling Baseline: 7, see above Goal status: INITIAL  2.  Pt will correct error'd sounds in 50% of opportunities at phrase level during structured practice  Baseline: attempts correction with support Goal status: INITIAL  3.  Pt will be 80% intelligible in question responses Baseline: 75% intelligible Goal status: INITIAL   ASSESSMENT:  CLINICAL IMPRESSION: Patient is a 38 y.o. M who was seen today for motor speech evaluation. 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 Pt's speech is c/b articulation errors (see assessment section) and some phonological processes. Additionally, increased rate negatively impacts the clarity of intended message. Pt's mother reports increased  motivation for working towards and correction of errors, feels more so than when previously attended ST. Pt does appear stimulible with mod-A, direct model and articulation cues for correction of some errors this date at word level. Pt would benefit from skilled ST to address aforementioned deficits to enhance communication efficacy.    OBJECTIVE IMPAIRMENTS: Objective impairments include  articulation disorder . These impairments are limiting  patient from effectively communicating at home and in community.Factors affecting potential to achieve goals and functional outcome are previous level of function.. Patient will benefit from skilled SLP services to address above impairments and improve overall function.  REHAB POTENTIAL: Good  PLAN:  SLP FREQUENCY: 1-2x/week  SLP DURATION: 12 weeks  PLANNED INTERVENTIONS: 92522- Speech Eval Sound Prod, Articulate, Phonological, 16109 Treatment of speech (30 or 45 min) , Multimodal communication approach, SLP instruction and feedback, Compensatory strategies, and Patient/family education    Roylene Reason, Student-SLP 05/01/2023, 2:48 PM  Check all possible CPT codes: See Planned Interventions List for Planned CPT Codes    Check all conditions that are expected to impact treatment: Cognitive Impairment or Intellectual disability   If treatment provided at initial evaluation, no treatment charged due to lack of authorization.

## 2023-05-06 ENCOUNTER — Ambulatory Visit: Payer: Commercial Managed Care - PPO | Admitting: Speech Pathology

## 2023-05-06 DIAGNOSIS — F800 Phonological disorder: Secondary | ICD-10-CM | POA: Diagnosis not present

## 2023-05-06 NOTE — Therapy (Signed)
OUTPATIENT SPEECH LANGUAGE PATHOLOGY TREATMENT NOTE   Patient Name: Roy Carr MRN: [id number] DOB:05-Oct-1985, 38 y.o., male Today's Date: 05/06/2023  PCP: Madelin Headings, MD  REFERRING PROVIDER: Madelin Headings, MD  END OF SESSION:  End of Session - 05/06/23 1335     Visit Number 6    Number of Visits 17    Date for SLP Re-Evaluation 06/13/23    SLP Start Time 1315    SLP Stop Time  1345   pt's mother requested abbreivated session   SLP Time Calculation (min) 30 min    Activity Tolerance Patient tolerated treatment well             Past Medical History:  Diagnosis Date   Hypothyroid    prev per Dr Fransico Michael    Personal history of spine surgery    Spondylisthesis    grade 2 corrected   Trisomy 21    Varicose veins    intervention 5 2011   Past Surgical History:  Procedure Laterality Date   ABLATION SAPHENOUS VEIN W/ RFA     SPINE SURGERY     WISDOM TOOTH EXTRACTION     Patient Active Problem List   Diagnosis Date Noted   Elevated MCV 08/18/2017   Rash and nonspecific skin eruption 07/08/2013   Low HDL (under 40) 07/08/2013   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  Rationale for Evaluation and Treatment: Habilitation  SUBJECTIVE:   SUBJECTIVE STATEMENT: Pleasant and engaged throughout entirety of session, mother notes increased accuracy with previously difficult sounds at home.  PERTINENT HISTORY: Trisomy 42, previously received extensive ST to address communication, has not in many years  PAIN:  Are you having pain? No  FALLS:  Has patient fallen in last 6 months?  No  LIVING ENVIRONMENT: Lives with: lives with their family Lives in: House/apartment  PLOF:  Level of assistance: Needed assistance with ADLs, Needed assistance with IADLS Employment: Full-time employment  PATIENT GOALS: to see if speech would be beneficial at this time to increase intelligibility   OBJECTIVE:  Note: Objective measures were completed at Evaluation unless otherwise noted.  PATIENT REPORTED OUTCOME MEASURES (PROM): Goal Attainment Scaling: Given a scale of 1-10, with 1 being unable to communicate, 10 being ideal communication abilities, where is [name] at now? 7 What characterizes a 7? Is communicating, is able to be understood by familiar listeners with context, challenges without context, strangers or community members unable to understand, mom has challenges understanding at times which causes frustration, [name] is trying to use clear speech and repeat when model is provided  What would an 8 look like? Improved ability to slow down, able to make small talk with community people, is practicing speech  TODAY'S TREATMENT:  05/06/23: ST continued targeting improved intelligibility focusing on personally relevant high frequency words using [name]'s speech book. Pt continues to benefit from min/moderate visual and verbal articulation cues resulting in improved accuracy of error 'd productions and challenging consonants/consonant clusters. ST also cued for reduced rate to improve accuracy with multi syllabic words and compound words.  05/02/23: [name] brings new speech book featuring family members and significant places to speech today. Continued to target improved intelligibility with focus on personally relevant high frequency words. Pt continues to benefit from articulation cues for improved accuracy of error'd  productions, visual and verbal cues for reduced rate for improved accuracy of multisyllabic words and increased intelligibility in multi-word utterances.   04/22/23: Addressed articulation via photo description task. Pt with usual articulation errors with ability to correct highly salient words with close approximation in 80% of opportunities given max-A modeling, shaping, verbal cues. Increased awareness and independent attempts noted with high repetitions of words such as "laura" or "christmas." Pt benefits from informal structured with increased engagement and stimulibility noted.   04/18/23: Addressed articulation in conversational speech with use of pacing aid PRN for multi-syllabic words for increased accuracy and intelligibility. Pt benefits from slowed rate to enhance communication partner understanding of message. Practiced airflow for /s/, tongue posture for /l/ with over accuracy ~50%. Pt benefits from choral production and visual cues for articulator placement. Plan to create "speech book" with personally relevant words and sentences regarding work, family, restaurants/food, and hobbies next session.   04/04/23: Addressed articulation via mass practice trials for target sound /l/ in all positions. Pt benefited from visual feedback via mirror, verbal cues for tongue elevation, direct model. Pt achieved 75% accuracy with initial position, 60% accuracy medial position. Typical production without cues is w/l. Target rate reduction utilizing visual and tactile cues via pacing board and provision of direct model. Pt able to improve intelligibility at phrase level during structured practice 50%. HEP provided.   03/21/23: Education provided on evaluation results and SLP's recommendations. Pt and caregiver verbalizes agreement with POC, all questions answered to satisfaction.    PATIENT EDUCATION: Education details: see above Person educated: Patient and Parent Education method: Software engineer Education comprehension: verbalized understanding, returned demonstration, verbal cues required, and needs further education  HOME EXERCISE PROGRAM: To be established initial therapy session   GOALS: Goals reviewed with patient? Yes  SHORT TERM GOALS: Target date: 05/02/2023  Pt will utilize strategies to slow rate of speech with usual mod-A at sentence level  Baseline: variable rate, increased rate decreases intelligibility.  Goal status: MET  2.  Pt will produce consonant clusters with 50% accuracy at word level during structured practice  Baseline: 25% accuracy Goal status: MET  3.  Pt will produce t, th, y in all positions with 50% accuracy at word level during structured practice  Baseline: 25% accuracy, is stimulible for targeted speech sounds  Goal status: deferred -- pt benefiting from more informal approach targeting overall improved intelligibility   4.  Pt will complete HEP BID 6/7 days week Baseline: not established  Goal status: MET   LONG TERM GOALS: Target date: 06/13/2023  Mom will rate pt's speech as 8/10 via goal attainment scaling Baseline: 7, see above Goal status: INITIAL  2.  Pt will correct error'd sounds in 50% of opportunities at phrase level during structured practice  Baseline: attempts correction with support Goal status: INITIAL  3.  Pt will be 80% intelligible in question responses Baseline: 75% intelligible Goal status:  INITIAL   ASSESSMENT:  CLINICAL IMPRESSION: Patient is a 38 y.o. M who was seen today for ST session targeting improved intelligibility via reducing rate and shaping of error'd productions of variety of consonants. Therapy is focusing on highly salient and personally relevant stimuli for improved motivation and engagement throughout sessions. Pt's mother reports carryover of treatment strategies at home and ongoing motivation for addressing motor speech at home. Of practiced targets, such as family member names,  pt demonstrating reduced cueing for improved frequency of accurate production. Pt would benefit from skilled ST to address aforementioned deficits to enhance communication efficacy.    OBJECTIVE IMPAIRMENTS: Objective impairments include  articulation disorder . These impairments are limiting patient from effectively communicating at home and in community.Factors affecting potential to achieve goals and functional outcome are previous level of function.. Patient will benefit from skilled SLP services to address above impairments and improve overall function.  REHAB POTENTIAL: Good  PLAN:  SLP FREQUENCY: 1-2x/week  SLP DURATION: 12 weeks  PLANNED INTERVENTIONS: 92522- Speech Eval Sound Prod, Articulate, Phonological, 19147 Treatment of speech (30 or 45 min) , Multimodal communication approach, SLP instruction and feedback, Compensatory strategies, and Patient/family education    Roylene Reason, Student-SLP 05/06/2023, 1:14 PM  Check all possible CPT codes: See Planned Interventions List for Planned CPT Codes    Check all conditions that are expected to impact treatment: Cognitive Impairment or Intellectual disability   If treatment provided at initial evaluation, no treatment charged due to lack of authorization.

## 2023-05-09 ENCOUNTER — Ambulatory Visit: Payer: Commercial Managed Care - PPO | Admitting: Speech Pathology

## 2023-05-09 ENCOUNTER — Encounter: Payer: Self-pay | Admitting: Speech Pathology

## 2023-05-09 DIAGNOSIS — F800 Phonological disorder: Secondary | ICD-10-CM

## 2023-05-09 NOTE — Therapy (Signed)
OUTPATIENT SPEECH LANGUAGE PATHOLOGY TREATMENT NOTE   Patient Name: Roy Carr MRN: [id number] DOB:09/24/1985, 38 y.o., male Today's Date: 05/09/2023  PCP: Madelin Headings, MD  REFERRING PROVIDER: Madelin Headings, MD  END OF SESSION:    Past Medical History:  Diagnosis Date   Hypothyroid    prev per Dr Fransico Michael    Personal history of spine surgery    Spondylisthesis    grade 2 corrected   Trisomy 21    Varicose veins    intervention 5 2011   Past Surgical History:  Procedure Laterality Date   ABLATION SAPHENOUS VEIN W/ RFA     SPINE SURGERY     WISDOM TOOTH EXTRACTION     Patient Active Problem List   Diagnosis Date Noted   Elevated MCV 08/18/2017   Rash and nonspecific skin eruption 07/08/2013   Low HDL (under 40) 07/08/2013   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  Rationale for Evaluation and Treatment: Habilitation  SUBJECTIVE:   SUBJECTIVE STATEMENT: Pleasant and engaged throughout entirety of session.  PERTINENT HISTORY: Trisomy 38, previously received extensive ST to address communication, has not in many years  PAIN:  Are you having pain? No  FALLS: Has patient fallen in last 6 months?  No  LIVING ENVIRONMENT: Lives with: lives with their family Lives in: House/apartment  PLOF:  Level of assistance: Needed assistance with ADLs, Needed assistance with IADLS Employment: Full-time employment  PATIENT GOALS: to see if speech would be beneficial at this time to increase intelligibility   OBJECTIVE:  Note: Objective measures  were completed at Evaluation unless otherwise noted.  PATIENT REPORTED OUTCOME MEASURES (PROM): Goal Attainment Scaling: Given a scale of 1-10, with 1 being unable to communicate, 10 being ideal communication abilities, where is Roy Carr at now? 7 What characterizes a 7? Is communicating, is able to be understood by familiar listeners with context, challenges without context, strangers or community members unable to understand, mom has challenges understanding at times which causes frustration, Roy Carr is trying to use clear speech and repeat when model is provided  What would an 8 look like? Improved ability to slow down, able to make small talk with community people, is practicing speech  TODAY'S TREATMENT:                                                                                                                                          05/09/23: ST continued targeting improved intelligibility focusing  on personally relevant high frequency words using Roy Carr's speech books. Pt continues to benefit from min/moderate visual and verbal articulation cues resulting in improved accuracy of error 'd productions and challenging consonants/consonant clusters. ST provided visual feedback using a mirror to aid the pt's articulator placement and cued for reduced rate to improve accuracy with multi syllabic words and compound words.  05/06/23: ST continued targeting improved intelligibility focusing on personally relevant high frequency words using Roy Carr's speech book. Pt continues to benefit from min/moderate visual and verbal articulation cues resulting in improved accuracy of error 'd productions and challenging consonants/consonant clusters. ST also cued for reduced rate to improve accuracy with multi syllabic words and compound words.  05/02/23: Roy Carr brings new speech book featuring family members and significant places to speech today. Continued to target improved intelligibility with focus on personally  relevant high frequency words. Pt continues to benefit from articulation cues for improved accuracy of error'd productions, visual and verbal cues for reduced rate for improved accuracy of multisyllabic words and increased intelligibility in multi-word utterances.   04/22/23: Addressed articulation via photo description task. Pt with usual articulation errors with ability to correct highly salient words with close approximation in 80% of opportunities given max-A modeling, shaping, verbal cues. Increased awareness and independent attempts noted with high repetitions of words such as "laura" or "christmas." Pt benefits from informal structured with increased engagement and stimulibility noted.   04/18/23: Addressed articulation in conversational speech with use of pacing aid PRN for multi-syllabic words for increased accuracy and intelligibility. Pt benefits from slowed rate to enhance communication partner understanding of message. Practiced airflow for /s/, tongue posture for /l/ with over accuracy ~50%. Pt benefits from choral production and visual cues for articulator placement. Plan to create "speech book" with personally relevant words and sentences regarding work, family, restaurants/food, and hobbies next session.   04/04/23: Addressed articulation via mass practice trials for target sound /l/ in all positions. Pt benefited from visual feedback via mirror, verbal cues for tongue elevation, direct model. Pt achieved 75% accuracy with initial position, 60% accuracy medial position. Typical production without cues is w/l. Target rate reduction utilizing visual and tactile cues via pacing board and provision of direct model. Pt able to improve intelligibility at phrase level during structured practice 50%. HEP provided.   03/21/23: Education provided on evaluation results and SLP's recommendations. Pt and caregiver verbalizes agreement with POC, all questions answered to satisfaction.    PATIENT  EDUCATION: Education details: see above Person educated: Patient and Parent Education method: Medical illustrator Education comprehension: verbalized understanding, returned demonstration, verbal cues required, and needs further education  HOME EXERCISE PROGRAM: To be established initial therapy session   GOALS: Goals reviewed with patient? Yes  SHORT TERM GOALS: Target date: 05/02/2023  Pt will utilize strategies to slow rate of speech with usual mod-A at sentence level  Baseline: variable rate, increased rate decreases intelligibility.  Goal status: MET  2.  Pt will produce consonant clusters with 50% accuracy at word level during structured practice  Baseline: 25% accuracy Goal status: MET  3.  Pt will produce t, th, y in all positions with 50% accuracy at word level during structured practice  Baseline: 25% accuracy, is stimulible for targeted speech sounds  Goal status: deferred -- pt benefiting from more informal approach targeting overall improved intelligibility   4.  Pt will complete HEP BID 6/7 days week Baseline: not established  Goal status: MET   LONG TERM GOALS: Target date: 06/13/2023  Mom will rate pt's speech as 8/10 via goal attainment scaling Baseline: 7, see above Goal status: INITIAL  2.  Pt will correct error'd sounds in 50% of opportunities at phrase level during structured practice  Baseline: attempts correction with support Goal status: INITIAL  3.  Pt will be 80% intelligible in question responses Baseline: 75% intelligible Goal status: INITIAL   ASSESSMENT:  CLINICAL IMPRESSION: Patient is a 38 y.o. M who was seen today for ST session targeting improved intelligibility via reducing rate and shaping of error'd productions of variety of consonants. Therapy is focusing on highly salient and personally relevant stimuli for improved motivation and engagement throughout sessions. Pt's mother reports carryover of treatment strategies at  home and ongoing motivation for addressing motor speech at home. Of practiced targets, such as family member names, pt demonstrating reduced cueing for improved frequency of accurate production. Pt would benefit from skilled ST to address aforementioned deficits to enhance communication efficacy.    OBJECTIVE IMPAIRMENTS: Objective impairments include  articulation disorder . These impairments are limiting patient from effectively communicating at home and in community.Factors affecting potential to achieve goals and functional outcome are previous level of function.. Patient will benefit from skilled SLP services to address above impairments and improve overall function.  REHAB POTENTIAL: Good  PLAN:  SLP FREQUENCY: 1-2x/week  SLP DURATION: 12 weeks  PLANNED INTERVENTIONS: 92522- Speech Eval Sound Prod, Articulate, Phonological, 08657 Treatment of speech (30 or 45 min) , Multimodal communication approach, SLP instruction and feedback, Compensatory strategies, and Patient/family education    Roylene Reason, Student-SLP 05/09/2023, 8:48 AM  Check all possible CPT codes: See Planned Interventions List for Planned CPT Codes    Check all conditions that are expected to impact treatment: Cognitive Impairment or Intellectual disability   If treatment provided at initial evaluation, no treatment charged due to lack of authorization.

## 2023-05-13 ENCOUNTER — Ambulatory Visit: Payer: Commercial Managed Care - PPO | Admitting: Speech Pathology

## 2023-05-13 DIAGNOSIS — F800 Phonological disorder: Secondary | ICD-10-CM | POA: Diagnosis not present

## 2023-05-13 NOTE — Therapy (Signed)
OUTPATIENT SPEECH LANGUAGE PATHOLOGY TREATMENT NOTE   Patient Name: Roy Carr MRN: [id number] DOB:01/25/1986, 38 y.o., male Today's Date: 05/13/2023  PCP: Madelin Headings, MD  REFERRING PROVIDER: Madelin Headings, MD  END OF SESSION:  End of Session - 05/13/23 1425     Visit Number 8    Number of Visits 17    Date for SLP Re-Evaluation 06/13/23    SLP Start Time 1400    SLP Stop Time  1445    SLP Time Calculation (min) 45 min    Activity Tolerance Patient tolerated treatment well              Past Medical History:  Diagnosis Date   Hypothyroid    prev per Dr Fransico Michael    Personal history of spine surgery    Spondylisthesis    grade 2 corrected   Trisomy 21    Varicose veins    intervention 5 2011   Past Surgical History:  Procedure Laterality Date   ABLATION SAPHENOUS VEIN W/ RFA     SPINE SURGERY     WISDOM TOOTH EXTRACTION     Patient Active Problem List   Diagnosis Date Noted   Elevated MCV 08/18/2017   Rash and nonspecific skin eruption 07/08/2013   Low HDL (under 40) 07/08/2013   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   Visit for preventive health examination 07/03/2012   Early cataracts, bilateral 07/03/2012   Personal history of spine surgery    Seborrheic dermatitis of scalp 06/23/2010   Trisomy 21    Spondylolisthesis    Hypothyroidism 06/16/2009   VARICOSE VEINS, LOWER EXTREMITIES 06/16/2009   XERODERMA 06/16/2009    ONSET DATE: referral 02/26/23  REFERRING DIAG: Q90.9 (ICD-10-CM) - Trisomy 21   THERAPY DIAG:  Articulation disorder  Rationale for Evaluation and Treatment: Habilitation  SUBJECTIVE:   SUBJECTIVE STATEMENT: Pleasant and engaged throughout session, family interested in more formal speech assessment to assess progress and next steps, ST plans to administer in future sessions.  PERTINENT HISTORY: Trisomy 68, previously received extensive ST to address communication, has not in many years  PAIN:  Are you having pain?  No  FALLS: Has patient fallen in last 6 months?  No  LIVING ENVIRONMENT: Lives with: lives with their family Lives in: House/apartment  PLOF:  Level of assistance: Needed assistance with ADLs, Needed assistance with IADLS Employment: Full-time employment  PATIENT GOALS: to see if speech would be beneficial at this time to increase intelligibility   OBJECTIVE:  Note: Objective measures were completed at Evaluation unless otherwise noted.  PATIENT REPORTED OUTCOME MEASURES (PROM): Goal Attainment Scaling: Given a scale of 1-10, with 1 being unable to communicate, 10 being ideal communication abilities, where is [name] at now? 7 What characterizes a 7? Is communicating, is able to be understood by familiar listeners with context, challenges without context, strangers or community members unable to understand, mom has challenges understanding at times which causes frustration, [name] is trying to use clear speech and repeat when model is provided  What would an 8 look like? Improved ability to slow down, able to make small talk with community people, is practicing speech  TODAY'S TREATMENT:  05/12/23: ST targeted improved intelligibility using personally relevant high frequency words, and facilitated additional practice with initial /l/, /r/ and medial /l/, sounds deemed challenging for [name]. Pt continues to benefit from min/moderate visual and verbal articulation cues resulting in improved accuracy of error 'd productions and challenging consonants/consonant clusters.  ST provided visual feedback using a mirror to aid the pt's articulator placement and cued for reduced rate to improve fluency and accuracy with multi syllabic words.                                                                                                                                        05/09/23: ST continued targeting improved intelligibility focusing on personally relevant high frequency words using [name]'s speech books. Pt continues to benefit from min/moderate visual and verbal articulation cues resulting in improved accuracy of error 'd productions and challenging consonants/consonant clusters. ST provided visual feedback using a mirror to aid the pt's articulator placement and cued for reduced rate to improve accuracy with multi syllabic words and compound words.  05/06/23: ST continued targeting improved intelligibility focusing on personally relevant high frequency words using [name]'s speech book. Pt continues to benefit from min/moderate visual and verbal articulation cues resulting in improved accuracy of error 'd productions and challenging consonants/consonant clusters. ST also cued for reduced rate to improve accuracy with multi syllabic words and compound words.  05/02/23: [name] brings new speech book featuring family members and significant places to speech today. Continued to target improved intelligibility with focus on personally relevant high frequency words. Pt continues to benefit from articulation cues for improved accuracy of error'd productions, visual and verbal cues for reduced rate for improved accuracy of multisyllabic words and increased intelligibility in multi-word utterances.   04/22/23: Addressed articulation via photo description task. Pt with usual articulation errors with ability to correct highly salient words with close approximation in 80% of opportunities given max-A modeling, shaping, verbal cues. Increased awareness and independent attempts noted with high repetitions of words such as "laura" or "christmas." Pt benefits from informal structured with increased engagement and stimulibility noted.   04/18/23: Addressed articulation in conversational speech with use of pacing aid PRN for multi-syllabic words for increased accuracy and intelligibility. Pt  benefits from slowed rate to enhance communication partner understanding of message. Practiced airflow for /s/, tongue posture for /l/ with over accuracy ~50%. Pt benefits from choral production and visual cues for articulator placement. Plan to create "speech book" with personally relevant words and sentences regarding work, family, restaurants/food, and hobbies next session.   04/04/23: Addressed articulation via mass practice trials for target sound /l/ in all positions. Pt benefited from visual feedback via mirror, verbal cues for tongue elevation, direct model. Pt achieved 75% accuracy with initial position, 60% accuracy medial position. Typical production without cues is w/l. Target rate reduction utilizing visual and tactile cues via pacing board and provision of direct model.  Pt able to improve intelligibility at phrase level during structured practice 50%. HEP provided.   03/21/23: Education provided on evaluation results and SLP's recommendations. Pt and caregiver verbalizes agreement with POC, all questions answered to satisfaction.    PATIENT EDUCATION: Education details: see above Person educated: Patient and Parent Education method: Medical illustrator Education comprehension: verbalized understanding, returned demonstration, verbal cues required, and needs further education  HOME EXERCISE PROGRAM: To be established initial therapy session   GOALS: Goals reviewed with patient? Yes  SHORT TERM GOALS: Target date: 05/02/2023  Pt will utilize strategies to slow rate of speech with usual mod-A at sentence level  Baseline: variable rate, increased rate decreases intelligibility.  Goal status: MET  2.  Pt will produce consonant clusters with 50% accuracy at word level during structured practice  Baseline: 25% accuracy Goal status: MET  3.  Pt will produce t, th, y in all positions with 50% accuracy at word level during structured practice  Baseline: 25% accuracy, is  stimulible for targeted speech sounds  Goal status: deferred -- pt benefiting from more informal approach targeting overall improved intelligibility   4.  Pt will complete HEP BID 6/7 days week Baseline: not established  Goal status: MET   LONG TERM GOALS: Target date: 06/13/2023  Mom will rate pt's speech as 8/10 via goal attainment scaling Baseline: 7, see above Goal status: INITIAL  2.  Pt will correct error'd sounds in 50% of opportunities at phrase level during structured practice  Baseline: attempts correction with support Goal status: INITIAL  3.  Pt will be 80% intelligible in question responses Baseline: 75% intelligible Goal status: INITIAL   ASSESSMENT:  CLINICAL IMPRESSION: Patient is a 38 y.o. M who was seen today for ST session targeting improved intelligibility via reducing rate and shaping of error'd productions of variety of consonants. Therapy is focusing on highly salient and personally relevant stimuli for improved motivation and engagement throughout sessions. Pt's mother reports carryover of treatment strategies at home and ongoing motivation for addressing motor speech at home. Pt required reduced cueing for increased frequency of accurate productions of personally relevant words including family members and work responsibilities. Pt would benefit from skilled ST to address aforementioned deficits to enhance communication efficacy.    OBJECTIVE IMPAIRMENTS: Objective impairments include  articulation disorder . These impairments are limiting patient from effectively communicating at home and in community.Factors affecting potential to achieve goals and functional outcome are previous level of function.. Patient will benefit from skilled SLP services to address above impairments and improve overall function.  REHAB POTENTIAL: Good  PLAN:  SLP FREQUENCY: 1-2x/week  SLP DURATION: 12 weeks  PLANNED INTERVENTIONS: 92522- Speech Eval Sound Prod, Articulate,  Phonological, 45409 Treatment of speech (30 or 45 min) , Multimodal communication approach, SLP instruction and feedback, Compensatory strategies, and Patient/family education    Roylene Reason, Student-SLP 05/13/2023, 3:05 PM  Check all possible CPT codes: See Planned Interventions List for Planned CPT Codes    Check all conditions that are expected to impact treatment: Cognitive Impairment or Intellectual disability   If treatment provided at initial evaluation, no treatment charged due to lack of authorization.

## 2023-05-16 ENCOUNTER — Ambulatory Visit: Payer: Commercial Managed Care - PPO | Admitting: Speech Pathology

## 2023-05-16 DIAGNOSIS — F800 Phonological disorder: Secondary | ICD-10-CM | POA: Diagnosis not present

## 2023-05-16 NOTE — Therapy (Signed)
OUTPATIENT SPEECH LANGUAGE PATHOLOGY TREATMENT NOTE   Patient Name: Roy Carr MRN: [id number] DOB:Jul 04, 1985, 38 y.o., male Today's Date: 05/16/2023  PCP: Madelin Headings, MD  REFERRING PROVIDER: Madelin Headings, MD  END OF SESSION:  End of Session - 05/16/23 0902     Visit Number 9    Number of Visits 17    Date for SLP Re-Evaluation 06/13/23    SLP Start Time 0845    SLP Stop Time  0930    SLP Time Calculation (min) 45 min    Activity Tolerance Patient tolerated treatment well             Past Medical History:  Diagnosis Date   Hypothyroid    prev per Dr Fransico Michael    Personal history of spine surgery    Spondylisthesis    grade 2 corrected   Trisomy 21    Varicose veins    intervention 5 2011   Past Surgical History:  Procedure Laterality Date   ABLATION SAPHENOUS VEIN W/ RFA     SPINE SURGERY     WISDOM TOOTH EXTRACTION     Patient Active Problem List   Diagnosis Date Noted   Elevated MCV 08/18/2017   Rash and nonspecific skin eruption 07/08/2013   Low HDL (under 40) 07/08/2013   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   Visit for preventive health examination 07/03/2012   Early cataracts, bilateral 07/03/2012   Personal history of spine surgery    Seborrheic dermatitis of scalp 06/23/2010   Trisomy 21    Spondylolisthesis    Hypothyroidism 06/16/2009   VARICOSE VEINS, LOWER EXTREMITIES 06/16/2009   XERODERMA 06/16/2009    ONSET DATE: referral 02/26/23  REFERRING DIAG: Q90.9 (ICD-10-CM) - Trisomy 21   THERAPY DIAG:  Articulation disorder  Rationale for Evaluation and Treatment: Habilitation  SUBJECTIVE:   SUBJECTIVE STATEMENT: Pleasant and engaged throughout session, family interested in more formal speech assessment to assess progress and next steps, ST plans to administer in future sessions.  PERTINENT HISTORY: Trisomy 58, previously received extensive ST to address communication, has not in many years  PAIN:  Are you having pain?  No  FALLS: Has patient fallen in last 6 months?  No  LIVING ENVIRONMENT: Lives with: lives with their family Lives in: House/apartment  PLOF:  Level of assistance: Needed assistance with ADLs, Needed assistance with IADLS Employment: Full-time employment  PATIENT GOALS: to see if speech would be beneficial at this time to increase intelligibility   OBJECTIVE:  Note: Objective measures were completed at Evaluation unless otherwise noted.  PATIENT REPORTED OUTCOME MEASURES (PROM): Goal Attainment Scaling: Given a scale of 1-10, with 1 being unable to communicate, 10 being ideal communication abilities, where is [name] at now? 7 What characterizes a 7? Is communicating, is able to be understood by familiar listeners with context, challenges without context, strangers or community members unable to understand, mom has challenges understanding at times which causes frustration, [name] is trying to use clear speech and repeat when model is provided  What would an 8 look like? Improved ability to slow down, able to make small talk with community people, is practicing speech  TODAY'S TREATMENT:  05/16/23: SLP targeted intelligibility using picture stimuli to facilitate discussion, identify personally relevant words, and target sounds deemed challenging for [name] including initial /v/, /l/, /r/ and medial /l/. Pt continues to benefit from min/moderate visual and verbal articulation cues resulting in improved accuracy of error 'd productions and challenging consonants/consonant clusters. SLP provided visual feedback to aid the pt's articulator placement and cued for reduced rate to improve fluency and accuracy with multi syllabic words.  SLP unable to obtain speech assessment for use in this session, but plans to implement next visit.   PATIENT EDUCATION: Education  details: see above Person educated: Patient and Parent Education method: Medical illustrator Education comprehension: verbalized understanding, returned demonstration, verbal cues required, and needs further education  HOME EXERCISE PROGRAM: To be established initial therapy session   GOALS: Goals reviewed with patient? Yes  SHORT TERM GOALS: Target date: 05/02/2023  Pt will utilize strategies to slow rate of speech with usual mod-A at sentence level  Baseline: variable rate, increased rate decreases intelligibility.  Goal status: MET  2.  Pt will produce consonant clusters with 50% accuracy at word level during structured practice  Baseline: 25% accuracy Goal status: MET  3.  Pt will produce t, th, y in all positions with 50% accuracy at word level during structured practice  Baseline: 25% accuracy, is stimulible for targeted speech sounds  Goal status: deferred -- pt benefiting from more informal approach targeting overall improved intelligibility   4.  Pt will complete HEP BID 6/7 days week Baseline: not established  Goal status: MET   LONG TERM GOALS: Target date: 06/13/2023  Mom will rate pt's speech as 8/10 via goal attainment scaling Baseline: 7, see above Goal status: ONGOING  2.  Pt will correct error'd sounds in 50% of opportunities at phrase level during structured practice  Baseline: attempts correction with support Goal status: ONGOING  3.  Pt will be 80% intelligible in question responses Baseline: 75% intelligible Goal status: ONGOING    ASSESSMENT:  CLINICAL IMPRESSION: Patient is a 38 y.o. M who was seen today for ST session targeting improved intelligibility via reducing rate and shaping of error'd productions of variety of consonants. Therapy is focusing on highly salient and personally relevant stimuli for improved motivation and engagement throughout sessions. Pt's mother reports carryover of treatment strategies at home and ongoing  motivation for addressing motor speech at home. Pt required reduced cueing for increased frequency of accurate productions of personally relevant words including family members and work responsibilities. Pt would benefit from skilled ST to address aforementioned deficits to enhance communication efficacy.    OBJECTIVE IMPAIRMENTS: Objective impairments include  articulation disorder . These impairments are limiting patient from effectively communicating at home and in community.Factors affecting potential to achieve goals and functional outcome are previous level of function.. Patient will benefit from skilled SLP services to address above impairments and improve overall function.  REHAB POTENTIAL: Good  PLAN:  SLP FREQUENCY: 1-2x/week  SLP DURATION: 12 weeks  PLANNED INTERVENTIONS: 92522- Speech Eval Sound Prod, Articulate, Phonological, 16109 Treatment of speech (30 or 45 min) , Multimodal communication approach, SLP instruction and feedback, Compensatory strategies, and Patient/family education    Roylene Reason, Student-SLP 05/16/2023, 10:03 AM  Check all possible CPT codes: See Planned Interventions List for Planned CPT Codes    Check all conditions that are expected to impact treatment: Cognitive Impairment or Intellectual disability   If treatment provided at initial evaluation, no treatment charged due to lack of authorization.

## 2023-05-23 ENCOUNTER — Ambulatory Visit: Payer: Commercial Managed Care - PPO | Attending: Internal Medicine | Admitting: Speech Pathology

## 2023-05-23 DIAGNOSIS — F800 Phonological disorder: Secondary | ICD-10-CM | POA: Diagnosis not present

## 2023-05-23 NOTE — Therapy (Signed)
 OUTPATIENT SPEECH LANGUAGE PATHOLOGY TREATMENT NOTE   Patient Name: Roy Carr MRN: [id number] DOB:[birth year], 38 y.o., male Today's Date: 05/23/2023  PCP: Roy Apolinar POUR, MD  REFERRING PROVIDER: Charlett Apolinar POUR, MD  END OF SESSION:  End of Session - 05/23/23 0845     Visit Number 10    Number of Visits 17    Date for SLP Re-Evaluation 06/13/23    SLP Start Time 0847    SLP Stop Time  0930    SLP Time Calculation (min) 43 min    Activity Tolerance Patient tolerated treatment well              Past Medical History:  Diagnosis Date   Hypothyroid    prev per Dr Roy Carr    Personal history of spine surgery    Spondylisthesis    grade 2 corrected   Trisomy 21    Varicose veins    intervention 5 2011   Past Surgical History:  Procedure Laterality Date   ABLATION SAPHENOUS VEIN W/ RFA     SPINE SURGERY     WISDOM TOOTH EXTRACTION     Patient Active Problem List   Diagnosis Date Noted   Elevated MCV 08/18/2017   Rash and nonspecific skin eruption 07/08/2013   Low HDL (under 40) 07/08/2013   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   Visit for preventive health examination 07/03/2012   Early cataracts, bilateral 07/03/2012   Personal history of spine surgery    Seborrheic dermatitis of scalp 06/23/2010   Trisomy 21    Spondylolisthesis    Hypothyroidism 06/16/2009   VARICOSE VEINS, LOWER EXTREMITIES 06/16/2009   XERODERMA 06/16/2009    ONSET DATE: referral 02/26/23  REFERRING DIAG: Q90.9 (ICD-10-CM) - Trisomy 21   THERAPY DIAG:  Articulation disorder  Rationale for Evaluation and Treatment: Habilitation  SUBJECTIVE:   SUBJECTIVE STATEMENT: Pleasant and engaged throughout session, family interested in more formal speech assessment to assess progress and next steps, ST plans to administer in future sessions.  PERTINENT HISTORY: Trisomy 42, previously received extensive ST to address communication, has not in many years  PAIN:  Are you having pain?  No  FALLS: Has patient fallen in last 6 months?  No  LIVING ENVIRONMENT: Lives with: lives with their family Lives in: House/apartment  PLOF:  Level of assistance: Needed assistance with ADLs, Needed assistance with IADLS Employment: Full-time employment  PATIENT GOALS: to see if speech would be beneficial at this time to increase intelligibility   OBJECTIVE:  Note: Objective measures were completed at Evaluation unless otherwise noted.  PATIENT REPORTED OUTCOME MEASURES (PROM): Goal Attainment Scaling: Given a scale of 1-10, with 1 being unable to communicate, 10 being ideal communication abilities, where is Roy Carr at now? 7 What characterizes a 7? Is communicating, is able to be understood by familiar listeners with context, challenges without context, strangers or community members unable to understand, mom has challenges understanding at times which causes frustration, Roy Carr is trying to use clear speech and repeat when model is provided  What would an 8 look like? Improved ability to slow down, able to make small talk with community people, is practicing speech  TODAY'S TREATMENT:  05/23/23: The Goldman-Fristoe Test of Articulation-3 (GFTA-3) was informally administered as an assessment of Roy Carr's articulation of consonant sounds at word level. During the GFTA-3, Roy Carr spontaneously or imitatively produces a single-word label after looking at pictures. Performance on this measure aides in providing additional information about Roy Carr's speech sound errors to inform therapeutic targets.   The following errors were noted:  Initial Medial Final  kw l r  dr f t  pl ng th  pr g g  sh v   sl r   g v   gl b   th    v    f    l    r    bl    br    tr    z    sw    p    h    k    st    sp    gr    j     Roy Carr primarily demonstrated  initial consonant errors, however some of the initial errors produced were inconsistent. Inconsistent initial errors include /p/, /h/, /t/, and /k/, inconsistent medial errors include /b/, /g/ and /ng/, and inconsistent final errors included /g/ and /r/. Pt also occasionally added unnecessary sounds to words included pisuh onto the beginning of spider and uh to the end of web. Pt was stimulable for deletion of added sounds, and correct productions of initial sounds /kw/, /sh/, /dr/, /sl/, /sw/, /fr/, /t/,/bl/, /j/, /z/, medial sounds, /v/, and final sounds /r/ and /g/.  ST plans to target consistent errors and errors pt continues to be stimulable for.  05/16/23: SLP targeted intelligibility using picture stimuli to facilitate discussion, identify personally relevant words, and target sounds deemed challenging for Roy Carr including initial /v/, /l/, /r/ and medial /l/. Pt continues to benefit from min/moderate visual and verbal articulation cues resulting in improved accuracy of error 'd productions and challenging consonants/consonant clusters. SLP provided visual feedback to aid the pt's articulator placement and cued for reduced rate to improve fluency and accuracy with multi syllabic words.  SLP unable to obtain speech assessment for use in this session, but plans to implement next visit.   PATIENT EDUCATION: Education details: see above Person educated: Patient and Parent Education method: Medical Illustrator Education comprehension: verbalized understanding, returned demonstration, verbal cues required, and needs further education  HOME EXERCISE PROGRAM: Caregiver cues/modeling, practice with personally relevant, salient words    GOALS: Goals reviewed with patient? Yes  SHORT TERM GOALS: Target date: 05/02/2023  Pt will utilize strategies to slow rate of speech with usual mod-A at sentence level  Baseline: variable rate, increased rate decreases intelligibility.  Goal status:  MET  2.  Pt will produce consonant clusters with 50% accuracy at word level during structured practice  Baseline: 25% accuracy Goal status: MET  3.  Pt will produce t, th, y in all positions with 50% accuracy at word level during structured practice  Baseline: 25% accuracy, is stimulible for targeted speech sounds  Goal status: deferred -- pt benefiting from more informal approach targeting overall improved intelligibility   4.  Pt will complete HEP BID 6/7 days week Baseline: not established  Goal status: MET   LONG TERM GOALS: Target date: 06/13/2023  Mom will rate pt's speech as 8/10 via goal attainment scaling Baseline: 7, see above Goal status: ONGOING  2.  Pt will correct error'd sounds in 50% of opportunities at phrase level during structured practice  Baseline: attempts correction with  support Goal status: ONGOING  3.  Pt will be 80% intelligible in question responses Baseline: 75% intelligible Goal status: ONGOING    ASSESSMENT:  CLINICAL IMPRESSION: Patient is a 38 y.o. M who was seen today for ST session targeting improved intelligibility via reducing rate and shaping of error'd productions of variety of consonants. Therapy is focusing on highly salient and personally relevant stimuli for improved motivation and engagement throughout sessions. Pt's mother reports carryover of treatment strategies at home and ongoing motivation for addressing motor speech at home. Pt requires cueing for increased frequency of accurate productions of personally relevant words including family members and work responsibilities. Pt would benefit from skilled ST to address aforementioned deficits to enhance communication efficacy.    OBJECTIVE IMPAIRMENTS: Objective impairments include  articulation disorder . These impairments are limiting patient from effectively communicating at home and in community.Factors affecting potential to achieve goals and functional outcome are previous level of  function.. Patient will benefit from skilled SLP services to address above impairments and improve overall function.  REHAB POTENTIAL: Good  PLAN:  SLP FREQUENCY: 1-2x/week  SLP DURATION: 12 weeks  PLANNED INTERVENTIONS: 92522- Speech Eval Sound Prod, Articulate, Phonological, 07492 Treatment of speech (30 or 45 min) , Multimodal communication approach, SLP instruction and feedback, Compensatory strategies, and Patient/family education    Schuyler Foley, Student-SLP 05/23/2023, 12:07 PM  Check all possible CPT codes: See Planned Interventions List for Planned CPT Codes    Check all conditions that are expected to impact treatment: Cognitive Impairment or Intellectual disability   If treatment provided at initial evaluation, no treatment charged due to lack of authorization.

## 2023-05-30 ENCOUNTER — Ambulatory Visit: Payer: Commercial Managed Care - PPO | Admitting: Speech Pathology

## 2023-05-30 DIAGNOSIS — F800 Phonological disorder: Secondary | ICD-10-CM

## 2023-05-30 NOTE — Therapy (Signed)
OUTPATIENT SPEECH LANGUAGE PATHOLOGY TREATMENT NOTE   Patient Name: Roy Carr MRN: [id number] DOB:05/19/1985, 38 y.o., male Today's Date: 05/30/2023  PCP: Madelin Headings, MD  REFERRING PROVIDER: Madelin Headings, MD  END OF SESSION:  End of Session - 05/30/23 0925     Visit Number 11    Number of Visits 17    Date for SLP Re-Evaluation 06/13/23    SLP Start Time 0845    SLP Stop Time  0930    SLP Time Calculation (min) 45 min    Activity Tolerance Patient tolerated treatment well               Past Medical History:  Diagnosis Date   Hypothyroid    prev per Dr Fransico Michael    Personal history of spine surgery    Spondylisthesis    grade 2 corrected   Trisomy 21    Varicose veins    intervention 5 2011   Past Surgical History:  Procedure Laterality Date   ABLATION SAPHENOUS VEIN W/ RFA     SPINE SURGERY     WISDOM TOOTH EXTRACTION     Patient Active Problem List   Diagnosis Date Noted   Elevated MCV 08/18/2017   Rash and nonspecific skin eruption 07/08/2013   Low HDL (under 40) 07/08/2013   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 Attends session with mother.   PERTINENT HISTORY: Trisomy 69, previously received extensive ST to address communication, has not in many years  PAIN:  Are you having pain? No  FALLS: Has patient fallen in last 6 months?  No  LIVING ENVIRONMENT: Lives with: lives with  their family Lives in: House/apartment  PLOF:  Level of assistance: Needed assistance with ADLs, Needed assistance with IADLS Employment: Full-time employment  PATIENT GOALS: to see if speech would be beneficial at this time to increase intelligibility   OBJECTIVE:  Note: Objective measures were completed at Evaluation unless otherwise noted.  PATIENT REPORTED OUTCOME MEASURES (PROM): Goal Attainment Scaling: Given a scale of 1-10, with 1 being unable to communicate, 10 being ideal communication abilities, where is [name] at now? 7 What characterizes a 7? Is communicating, is able to be understood by familiar listeners with context, challenges without context, strangers or community members unable to understand, mom has challenges understanding at times which causes frustration, [name] is trying to use clear speech and repeat when model is provided  What would an 8 look like? Improved ability to slow down, able to make small talk with community people, is practicing speech  TODAY'S TREATMENT:  05/30/23: SLP provided pt and family with handout outlining error'd productions from last week's completion of GFTA-3. Discussed generating personally salient target words for practice, planning to implement modified cycles approach. Pt participated in structured practice this date with focus on /l/ and /l/ blends during themed activity. Ongoing modeling provided, though pt demonstrating improvement with regards to efficacy of cues and decreased cues required for obtaining close approxmiation of targets initially in error. Ongoing verbal cues and modeling for utilizing slow rate for overall increased intelligibility.   05/23/23: The Goldman-Fristoe Test of Articulation-3 (GFTA-3) was informally administered as an assessment of [name]'s articulation of consonant sounds at word  level. During the GFTA-3, [name] spontaneously or imitatively produces a single-word label after looking at pictures. Performance on this measure aides in providing additional information about [name]'s speech sound errors to inform therapeutic targets.   The following errors were noted:  Initial Medial Final  kw l r  dr f t  pl ng th  pr g g  sh v   sl r   g v   gl b   th    v    f    l    r    bl    br    tr    z    sw    p    h    k    st    sp    gr    j     [name] primarily demonstrated initial consonant errors, however some of the initial errors produced were inconsistent. Inconsistent initial errors include /p/, /h/, /t/, and /k/, inconsistent medial errors include /b/, /g/ and /ng/, and inconsistent final errors included /g/ and /r/. Pt also occasionally added unnecessary sounds to words included "pisuh" onto the beginning of "spider" and "uh" to the end of "web." Pt was stimulable for deletion of added sounds, and correct productions of initial sounds /kw/, /sh/, /dr/, /sl/, /sw/, /fr/, /t/,/bl/, /j/, /z/, medial sounds, /v/, and final sounds /r/ and /g/.  ST plans to target consistent errors and errors pt continues to be stimulable for.  05/16/23: SLP targeted intelligibility using picture stimuli to facilitate discussion, identify personally relevant words, and target sounds deemed challenging for [name] including initial /v/, /l/, /r/ and medial /l/. Pt continues to benefit from min/moderate visual and verbal articulation cues resulting in improved accuracy of error 'd productions and challenging consonants/consonant clusters. SLP provided visual feedback to aid the pt's articulator placement and cued for reduced rate to improve fluency and accuracy with multi syllabic words.  SLP unable to obtain speech assessment for use in this session, but plans to implement next visit.   PATIENT EDUCATION: Education details: see above Person educated: Patient and Parent Education  method: Medical illustrator Education comprehension: verbalized understanding, returned demonstration, verbal cues required, and needs further education  HOME EXERCISE PROGRAM: Caregiver cues/modeling, practice with personally relevant, salient words    GOALS: Goals reviewed with patient? Yes  SHORT TERM GOALS: Target date: 05/02/2023  Pt will utilize strategies to slow rate of speech with usual mod-A at sentence level  Baseline: variable rate, increased rate decreases intelligibility.  Goal status: MET  2.  Pt will produce consonant clusters with 50% accuracy at word level during structured practice  Baseline: 25% accuracy Goal status: MET  3.  Pt will produce t, th, y in all positions with 50% accuracy at word level during structured practice  Baseline: 25% accuracy, is  stimulible for targeted speech sounds  Goal status: deferred -- pt benefiting from more informal approach targeting overall improved intelligibility   4.  Pt will complete HEP BID 6/7 days week Baseline: not established  Goal status: MET   LONG TERM GOALS: Target date: 06/13/2023  Mom will rate pt's speech as 8/10 via goal attainment scaling Baseline: 7, see above Goal status: ONGOING  2.  Pt will correct error'd sounds in 50% of opportunities at phrase level during structured practice  Baseline: attempts correction with support Goal status: ONGOING  3.  Pt will be 80% intelligible in question responses Baseline: 75% intelligible Goal status: ONGOING    ASSESSMENT:  CLINICAL IMPRESSION: Patient is a 38 y.o. M who was seen today for ST session targeting improved intelligibility via reducing rate and shaping of error'd productions of variety of consonants. Therapy is focusing on highly salient and personally relevant stimuli for improved motivation and engagement throughout sessions. Pt's mother reports carryover of treatment strategies at home and ongoing motivation for addressing motor speech  at home. Pt requires cueing for increased frequency of accurate productions of personally relevant words including family members and work responsibilities. Pt would benefit from skilled ST to address aforementioned deficits to enhance communication efficacy.    OBJECTIVE IMPAIRMENTS: Objective impairments include  articulation disorder . These impairments are limiting patient from effectively communicating at home and in community.Factors affecting potential to achieve goals and functional outcome are previous level of function.. Patient will benefit from skilled SLP services to address above impairments and improve overall function.  REHAB POTENTIAL: Good  PLAN:  SLP FREQUENCY: 1-2x/week  SLP DURATION: 12 weeks  PLANNED INTERVENTIONS: 92522- Speech Eval Sound Prod, Articulate, Phonological, 09811 Treatment of speech (30 or 45 min) , Multimodal communication approach, SLP instruction and feedback, Compensatory strategies, and Patient/family education    Maia Breslow, CCC-SLP 05/30/2023, 9:25 AM  Check all possible CPT codes: See Planned Interventions List for Planned CPT Codes    Check all conditions that are expected to impact treatment: Cognitive Impairment or Intellectual disability   If treatment provided at initial evaluation, no treatment charged due to lack of authorization.

## 2023-06-06 ENCOUNTER — Ambulatory Visit: Payer: Commercial Managed Care - PPO | Admitting: Speech Pathology

## 2023-06-06 DIAGNOSIS — F800 Phonological disorder: Secondary | ICD-10-CM | POA: Diagnosis not present

## 2023-06-06 NOTE — Therapy (Signed)
 OUTPATIENT SPEECH LANGUAGE PATHOLOGY TREATMENT NOTE   Patient Name: Roy Carr MRN: [id number] DOB:18-Nov-1985, 38 y.o., male Today's Date: 06/06/2023  PCP: Roy Headings, MD  REFERRING PROVIDER: Madelin Headings, MD  END OF SESSION:  End of Session - 06/06/23 0856     Visit Number 12    Number of Visits 17    Date for SLP Re-Evaluation 06/13/23    SLP Start Time 0845    SLP Stop Time  0930    SLP Time Calculation (min) 45 min    Activity Tolerance Patient tolerated treatment well                Past Medical History:  Diagnosis Date   Hypothyroid    prev per Dr Fransico Michael    Personal history of spine surgery    Spondylisthesis    grade 2 corrected   Trisomy 21    Varicose veins    intervention 5 2011   Past Surgical History:  Procedure Laterality Date   ABLATION SAPHENOUS VEIN W/ RFA     SPINE SURGERY     WISDOM TOOTH EXTRACTION     Patient Active Problem List   Diagnosis Date Noted   Elevated MCV 08/18/2017   Rash and nonspecific skin eruption 07/08/2013   Low HDL (under 40) 07/08/2013   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   Visit for preventive health examination 07/03/2012   Early cataracts, bilateral 07/03/2012   Personal history of spine surgery    Seborrheic dermatitis of scalp 06/23/2010   Trisomy 21    Spondylolisthesis    Hypothyroidism 06/16/2009   VARICOSE VEINS, LOWER EXTREMITIES 06/16/2009   XERODERMA 06/16/2009    ONSET DATE: referral 02/26/23  REFERRING DIAG: Q90.9 (ICD-10-CM) - Trisomy 21   THERAPY DIAG:  Articulation disorder  Rationale for Evaluation and Treatment: Habilitation  SUBJECTIVE:   SUBJECTIVE STATEMENT: Mother reports pt's co-workers have commented on positive perception of speech intelligibility since onset of ST.   PERTINENT HISTORY: Trisomy 77, previously received extensive ST to address communication, has not in many years  PAIN:  Are you having pain? No  FALLS: Has patient fallen in last 6 months?   No  LIVING ENVIRONMENT: Lives with: lives with their family Lives in: House/apartment  PLOF:  Level of assistance: Needed assistance with ADLs, Needed assistance with IADLS Employment: Full-time employment  PATIENT GOALS: to see if speech would be beneficial at this time to increase intelligibility   OBJECTIVE:  Note: Objective measures were completed at Evaluation unless otherwise noted.  GFTA Errors: Initial Medial Final  kw l r  dr f t  pl ng th  pr g g  sh v   sl r   g v   gl b   th    v    f    l    r    bl    br    tr    z    sw    p    h    k    st    sp    gr    j      PATIENT REPORTED OUTCOME MEASURES (PROM): Goal Attainment Scaling: Given a scale of 1-10, with 1 being unable to communicate, 10 being ideal communication abilities, where is Roy Carr at now? 7 What characterizes a 7? Is communicating, is able to be understood by familiar listeners with context, challenges without context, strangers or community members unable to understand, mom  has challenges understanding at times which causes frustration, Roy Carr is trying to use clear speech and repeat when model is provided  What would an 8 look like? Improved ability to slow down, able to make small talk with community people, is practicing speech  TODAY'S TREATMENT:                                                                                                                                            06/06/23: SLP led pt through structured task targeting /l/ in initial and medal word positions. Focused on quality, frequent repetitions for increased accuracy. Pt benefits from visual and verbal cues for articulatory placement and modeling of desired target. Accuracy noted to improve as session progressed with few instances of self-correction. Occasional adding of extra syllable which requires max-A to correct. Throughout session, continued to cue for slow rate to enhance overall intelligibility with pt  demonstrating returns 90% of opportunities. Plan to review Goal Attainment Scaling next session.   05/30/23: SLP provided pt and family with handout outlining error'd productions from last week's completion of GFTA-3. Discussed generating personally salient target words for practice, planning to implement modified cycles approach. Pt participated in structured practice this date with focus on /l/ and /l/ blends during themed activity. Ongoing modeling provided, though pt demonstrating improvement with regards to efficacy of cues and decreased cues required for obtaining close approxmiation of targets initially in error. Ongoing verbal cues and modeling for utilizing slow rate for overall increased intelligibility.   05/23/23: The Goldman-Fristoe Test of Articulation-3 (GFTA-3) was informally administered as an assessment of Roy Carr's articulation of consonant sounds at word level. During the GFTA-3, Roy Carr spontaneously or imitatively produces a single-word label after looking at pictures. Performance on this measure aides in providing additional information about Roy Carr's speech sound errors to inform therapeutic targets. Roy Carr primarily demonstrated initial consonant errors, however some of the initial errors produced were inconsistent. Inconsistent initial errors include /p/, /h/, /t/, and /k/, inconsistent medial errors include /b/, /g/ and /ng/, and inconsistent final errors included /g/ and /r/. Pt also occasionally added unnecessary sounds to words included "pisuh" onto the beginning of "spider" and "uh" to the end of "web." Pt was stimulable for deletion of added sounds, and correct productions of initial sounds /kw/, /sh/, /dr/, /sl/, /sw/, /fr/, /t/,/bl/, /j/, /z/, medial sounds, /v/, and final sounds /r/ and /g/.  ST plans to target consistent errors and errors pt continues to be stimulable for.  05/16/23: SLP targeted intelligibility using picture stimuli to facilitate discussion, identify personally  relevant words, and target sounds deemed challenging for Roy Carr including initial /v/, /l/, /r/ and medial /l/. Pt continues to benefit from min/moderate visual and verbal articulation cues resulting in improved accuracy of error 'd productions and challenging consonants/consonant clusters. SLP provided visual feedback to aid the pt's articulator placement and cued for reduced rate to improve  fluency and accuracy with multi syllabic words.  SLP unable to obtain speech assessment for use in this session, but plans to implement next visit.   PATIENT EDUCATION: Education details: see above Person educated: Patient and Parent Education method: Medical illustrator Education comprehension: verbalized understanding, returned demonstration, verbal cues required, and needs further education  HOME EXERCISE PROGRAM: Caregiver cues/modeling, practice with personally relevant, salient words    GOALS: Goals reviewed with patient? Yes  SHORT TERM GOALS: Target date: 05/02/2023  Pt will utilize strategies to slow rate of speech with usual mod-A at sentence level  Baseline: variable rate, increased rate decreases intelligibility.  Goal status: MET  2.  Pt will produce consonant clusters with 50% accuracy at word level during structured practice  Baseline: 25% accuracy Goal status: MET  3.  Pt will produce t, th, y in all positions with 50% accuracy at word level during structured practice  Baseline: 25% accuracy, is stimulible for targeted speech sounds  Goal status: deferred -- pt benefiting from more informal approach targeting overall improved intelligibility   4.  Pt will complete HEP BID 6/7 days week Baseline: not established  Goal status: MET   LONG TERM GOALS: Target date: 06/13/2023  Mom will rate pt's speech as 8/10 via goal attainment scaling Baseline: 7, see above Goal status: ONGOING  2.  Pt will correct error'd sounds in 50% of opportunities at phrase level during  structured practice  Baseline: attempts correction with support Goal status: ONGOING  3.  Pt will be 80% intelligible in question responses Baseline: 75% intelligible Goal status: ONGOING    ASSESSMENT:  CLINICAL IMPRESSION: Patient is a 38 y.o. M who was seen today for ST session targeting improved intelligibility via reducing rate and shaping of error'd productions of variety of consonants. Therapy is focusing on highly salient and personally relevant stimuli for improved motivation and engagement throughout sessions. Pt's mother reports carryover of treatment strategies at home and ongoing motivation for addressing motor speech at home. Pt requires cueing for increased frequency of accurate productions of personally relevant words including family members and work responsibilities. Pt would benefit from skilled ST to address aforementioned deficits to enhance communication efficacy.    OBJECTIVE IMPAIRMENTS: Objective impairments include  articulation disorder . These impairments are limiting patient from effectively communicating at home and in community.Factors affecting potential to achieve goals and functional outcome are previous level of function.. Patient will benefit from skilled SLP services to address above impairments and improve overall function.  REHAB POTENTIAL: Good  PLAN:  SLP FREQUENCY: 1-2x/week  SLP DURATION: 12 weeks  PLANNED INTERVENTIONS: 92522- Speech Eval Sound Prod, Articulate, Phonological, 78295 Treatment of speech (30 or 45 min) , Multimodal communication approach, SLP instruction and feedback, Compensatory strategies, and Patient/family education    Maia Breslow, CCC-SLP 06/06/2023, 8:56 AM  Check all possible CPT codes: See Planned Interventions List for Planned CPT Codes    Check all conditions that are expected to impact treatment: Cognitive Impairment or Intellectual disability   If treatment provided at initial evaluation, no treatment  charged due to lack of authorization.

## 2023-06-13 ENCOUNTER — Ambulatory Visit: Payer: Commercial Managed Care - PPO | Admitting: Speech Pathology

## 2023-06-13 DIAGNOSIS — F800 Phonological disorder: Secondary | ICD-10-CM

## 2023-06-13 NOTE — Therapy (Signed)
 OUTPATIENT SPEECH LANGUAGE PATHOLOGY TREATMENT NOTE (RECERTIFICATION)   Patient Name: Roy Carr MRN: [id number] DOB:1986/03/10, 38 y.o., male Today's Date: 06/13/2023  PCP: Madelin Headings, MD  REFERRING PROVIDER: Madelin Headings, MD  END OF SESSION:  End of Session - 06/13/23 0848     Visit Number 13    Number of Visits 21   +4 visits for recertification   Date for SLP Re-Evaluation 08/08/23   +8 weeks for recertification   SLP Start Time 0849    SLP Stop Time  0930    SLP Time Calculation (min) 41 min    Activity Tolerance Patient tolerated treatment well             Past Medical History:  Diagnosis Date   Hypothyroid    prev per Dr Fransico Michael    Personal history of spine surgery    Spondylisthesis    grade 2 corrected   Trisomy 21    Varicose veins    intervention 5 2011   Past Surgical History:  Procedure Laterality Date   ABLATION SAPHENOUS VEIN W/ RFA     SPINE SURGERY     WISDOM TOOTH EXTRACTION     Patient Active Problem List   Diagnosis Date Noted   Elevated MCV 08/18/2017   Rash and nonspecific skin eruption 07/08/2013   Low HDL (under 40) 07/08/2013   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   Visit for preventive health examination 07/03/2012   Early cataracts, bilateral 07/03/2012   Personal history of spine surgery    Seborrheic dermatitis of scalp 06/23/2010   Trisomy 21    Spondylolisthesis    Hypothyroidism 06/16/2009   VARICOSE VEINS, LOWER EXTREMITIES 06/16/2009   XERODERMA 06/16/2009    ONSET DATE: referral 02/26/23  REFERRING DIAG: Q90.9 (ICD-10-CM) - Trisomy 21   THERAPY DIAG:  Articulation disorder  Rationale for Evaluation and Treatment: Habilitation  SUBJECTIVE:   SUBJECTIVE STATEMENT: Mother reports pt's co-workers have commented on positive perception of speech intelligibility since onset of ST.   PERTINENT HISTORY: Trisomy 41, previously received extensive ST to address communication, has not in many years  PAIN:  Are you  having pain? No  FALLS: Has patient fallen in last 6 months?  No  LIVING ENVIRONMENT: Lives with: lives with their family Lives in: House/apartment  PLOF:  Level of assistance: Needed assistance with ADLs, Needed assistance with IADLS Employment: Full-time employment  PATIENT GOALS: to see if speech would be beneficial at this time to increase intelligibility   OBJECTIVE:  Note: Objective measures were completed at Evaluation unless otherwise noted.  GFTA Errors: Initial Medial Final  kw l r  dr f t  pl ng th  pr g g  sh v   sl r   g v   gl b   th    v    f    l    r    bl    br    tr    z    sw    p    h    k    st    sp    gr    j      PATIENT REPORTED OUTCOME MEASURES (PROM): Goal Attainment Scaling: Given a scale of 1-10, with 1 being unable to communicate, 10 being ideal communication abilities, where is [name] at now? 7 What characterizes a 7? Is communicating, is able to be understood by familiar listeners with context, challenges without context,  strangers or community members unable to understand, mom has challenges understanding at times which causes frustration, [name] is trying to use clear speech and repeat when model is provided  What would an 8 look like? Improved ability to slow down, able to make small talk with community people, is practicing speech 8: Improved ability to slow down, able to make small talk with community people, is practicing speech, initiating interactions  What would a 9 look like? Less prompting for slowing down, increased self correction  TODAY'S TREATMENT: 06/13/23:  SLP led pt through Goal Attainment Scaling addressing [name]'s progress made in therapy, and perception of where he is now on a scale of 1-10 in relation to goals set at the beginning of therpay. Reviewed how goal was characterized. Pt's mother feels [name] has met his goal of an "8" (see above) and continues to endorse improvements noted outside of therapy  sessions. SLP A mother in determining "9" using goal attainment scaling (see above). SLP initiated structured task targeting /r/ in initial, medal, and final word positions. Focused on quality, frequent repetitions for increased accuracy. Pt benefits from visual and verbal cues for articulatory placement and modeling of desired target. Intermittent demonstration of carryover of slow rate without cues resulting in overall increased intelligibility.   06/06/23: SLP led pt through structured task targeting /l/ in initial and medal word positions. Focused on quality, frequent repetitions for increased accuracy. Pt benefits from visual and verbal cues for articulatory placement and modeling of desired target. Accuracy noted to improve as session progressed with few instances of self-correction. Occasional adding of extra syllable which requires max-A to correct. Throughout session, continued to cue for slow rate to enhance overall intelligibility with pt demonstrating returns 90% of opportunities. Plan to review Goal Attainment Scaling next session.   05/30/23: SLP provided pt and family with handout outlining error'd productions from last week's completion of GFTA-3. Discussed generating personally salient target words for practice, planning to implement modified cycles approach. Pt participated in structured practice this date with focus on /l/ and /l/ blends during themed activity. Ongoing modeling provided, though pt demonstrating improvement with regards to efficacy of cues and decreased cues required for obtaining close approxmiation of targets initially in error. Ongoing verbal cues and modeling for utilizing slow rate for overall increased intelligibility.   05/23/23: The Goldman-Fristoe Test of Articulation-3 (GFTA-3) was informally administered as an assessment of [name]'s articulation of consonant sounds at word level. During the GFTA-3, [name] spontaneously or imitatively produces a single-word label after  looking at pictures. Performance on this measure aides in providing additional information about [name]'s speech sound errors to inform therapeutic targets. [name] primarily demonstrated initial consonant errors, however some of the initial errors produced were inconsistent. Inconsistent initial errors include /p/, /h/, /t/, and /k/, inconsistent medial errors include /b/, /g/ and /ng/, and inconsistent final errors included /g/ and /r/. Pt also occasionally added unnecessary sounds to words included "pisuh" onto the beginning of "spider" and "uh" to the end of "web." Pt was stimulable for deletion of added sounds, and correct productions of initial sounds /kw/, /sh/, /dr/, /sl/, /sw/, /fr/, /t/,/bl/, /j/, /z/, medial sounds, /v/, and final sounds /r/ and /g/.  ST plans to target consistent errors and errors pt continues to be stimulable for.  05/16/23: SLP targeted intelligibility using picture stimuli to facilitate discussion, identify personally relevant words, and target sounds deemed challenging for [name] including initial /v/, /l/, /r/ and medial /l/. Pt continues to benefit from min/moderate visual and  verbal articulation cues resulting in improved accuracy of error 'd productions and challenging consonants/consonant clusters. SLP provided visual feedback to aid the pt's articulator placement and cued for reduced rate to improve fluency and accuracy with multi syllabic words.  SLP unable to obtain speech assessment for use in this session, but plans to implement next visit.   PATIENT EDUCATION: Education details: see above Person educated: Patient and Parent Education method: Medical illustrator Education comprehension: verbalized understanding, returned demonstration, verbal cues required, and needs further education  HOME EXERCISE PROGRAM: Caregiver cues/modeling, practice with personally relevant, salient words    GOALS: Goals reviewed with patient? Yes  SHORT TERM GOALS: Target  date: 05/02/2023  Pt will utilize strategies to slow rate of speech with usual mod-A at sentence level  Baseline: variable rate, increased rate decreases intelligibility.  Goal status: MET  2.  Pt will produce consonant clusters with 50% accuracy at word level during structured practice  Baseline: 25% accuracy Goal status: MET  3.  Pt will produce t, th, y in all positions with 50% accuracy at word level during structured practice  Baseline: 25% accuracy, is stimulible for targeted speech sounds  Goal status: deferred -- pt benefiting from more informal approach targeting overall improved intelligibility   4.  Pt will complete HEP BID 6/7 days week Baseline: not established  Goal status: MET   LONG TERM GOALS: Target date:  08/08/2023 (recert- goals updated)  Mom will rate pt's speech as 8/10 via goal attainment scaling Baseline: 7, see above Goal status: MET  2.  Pt will correct error'd sounds in 50% of opportunities at phrase level during structured practice  Baseline: attempts correction with support; correcting at word level  and occasionally in conversation (06/13/2023) Goal status: ONGOING  3.  Pt will be 80% intelligible in question responses Baseline: 75% intelligible; improved utilization of strategies with decreased cueing required (06/13/2023) Goal status: MET  4.   Mom will rate pt's speech as 9/10 via goal attainment scaling by d/c  Baseline: 8/10 (see above)   Goal status: new at recert  5.   Pt will attempt self-correction for implementation of intelligibility strategies verbal cues only, no direct model   Baseline: provision of direct model for speech with decreased clarity  Goal status: new at recert   ASSESSMENT:  CLINICAL IMPRESSION: Patient is a 38 y.o. M who was seen today for ST session targeting improved intelligibility via reducing rate and shaping of error'd productions of variety of consonants. Therapy is focusing on highly salient and personally  relevant stimuli for improved motivation and engagement throughout sessions. Pt's mother reports carryover of treatment strategies at home and ongoing motivation for addressing motor speech at home. Pt requires cueing for increased frequency of accurate productions of personally relevant words including family members and work responsibilities. Pt would benefit from skilled ST to address aforementioned deficits to enhance communication efficacy.    OBJECTIVE IMPAIRMENTS: Objective impairments include  articulation disorder . These impairments are limiting patient from effectively communicating at home and in community.Factors affecting potential to achieve goals and functional outcome are previous level of function.. Patient will benefit from skilled SLP services to address above impairments and improve overall function.  REHAB POTENTIAL: Good  PLAN:  SLP FREQUENCY: 1-2x/week  SLP DURATION: 12 weeks  PLANNED INTERVENTIONS: 92522- Speech Eval Sound Prod, Articulate, Phonological, 16109 Treatment of speech (30 or 45 min) , Multimodal communication approach, SLP instruction and feedback, Compensatory strategies, and Patient/family education   [place],  Student-SLP 06/13/2023, 9:58 AM  Check all possible CPT codes: See Planned Interventions List for Planned CPT Codes    Check all conditions that are expected to impact treatment: Cognitive Impairment or Intellectual disability   If treatment provided at initial evaluation, no treatment charged due to lack of authorization.

## 2023-06-20 ENCOUNTER — Ambulatory Visit: Payer: Self-pay | Attending: Internal Medicine | Admitting: Speech Pathology

## 2023-06-20 DIAGNOSIS — F800 Phonological disorder: Secondary | ICD-10-CM | POA: Diagnosis not present

## 2023-06-20 NOTE — Therapy (Signed)
 OUTPATIENT SPEECH LANGUAGE PATHOLOGY TREATMENT NOTE (RECERTIFICATION)   Patient Name: Roy Carr MRN: [id number] DOB:29-Nov-1985, 37 y.o., male Today's Date: 06/20/2023  PCP: Madelin Headings, MD  REFERRING PROVIDER: Madelin Headings, MD  END OF SESSION:  End of Session - 06/20/23 0845     Visit Number 14    Number of Visits 21    Date for SLP Re-Evaluation 08/08/23    SLP Start Time 0846    SLP Stop Time  0930    SLP Time Calculation (min) 44 min    Activity Tolerance Patient tolerated treatment well             Past Medical History:  Diagnosis Date   Hypothyroid    prev per Dr Fransico Michael    Personal history of spine surgery    Spondylisthesis    grade 2 corrected   Trisomy 21    Varicose veins    intervention 5 2011   Past Surgical History:  Procedure Laterality Date   ABLATION SAPHENOUS VEIN W/ RFA     SPINE SURGERY     WISDOM TOOTH EXTRACTION     Patient Active Problem List   Diagnosis Date Noted   Elevated MCV 08/18/2017   Rash and nonspecific skin eruption 07/08/2013   Low HDL (under 40) 07/08/2013   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  Rationale for Evaluation and Treatment: Habilitation  SUBJECTIVE:   SUBJECTIVE STATEMENT: Pt presenting with low mood, per mom, 2/2 sister leaving town. Upon initiation of therapy activities, pt demonstrating increased participation and return to typical affect.   PERTINENT HISTORY: Trisomy 68, previously received extensive ST to address communication, has not in many years  PAIN:  Are you  having pain? No  FALLS: Has patient fallen in last 6 months?  No  LIVING ENVIRONMENT: Lives with: lives with their family Lives in: House/apartment  PLOF:  Level of assistance: Needed assistance with ADLs, Needed assistance with IADLS Employment: Full-time employment  PATIENT GOALS: to see if speech would be beneficial at this time to increase intelligibility   OBJECTIVE:  Note: Objective measures were completed at Evaluation unless otherwise noted.  GFTA Errors: Initial Medial Final  kw l r  dr f t  pl ng th  pr g g  sh v   sl r   g v   gl b   th    v    f    l    r    bl    br    tr    z    sw    p    h    k    st    sp    gr    j      PATIENT REPORTED OUTCOME MEASURES (PROM): Goal Attainment Scaling: Given a scale of 1-10, with 1 being unable to communicate, 10 being ideal communication abilities, where is Roy Carr at now? 7 What characterizes a 7? Is communicating, is able to be understood by familiar listeners with context, challenges without context,  strangers or community members unable to understand, mom has challenges understanding at times which causes frustration, Roy Carr is trying to use clear speech and repeat when model is provided  What would an 8 look like? Improved ability to slow down, able to make small talk with community people, is practicing speech 8: Improved ability to slow down, able to make small talk with community people, is practicing speech, initiating interactions  What would a 9 look like? Less prompting for slowing down, increased self correction  TODAY'S TREATMENT: 06/20/23: Focused session on production of "th" in all word positions. SLP provided direct modeling, articulation cues, and visual feedback for increased accuracy of target. Usual substitution of /f/ for "th" target. Responsive to SLP cues for improved accuracy in ~40% of opportunities, with cues decreasing as session progressed. Mother ID x5 highly salient targets for this  phoneme. Will continue to target.   06/13/23:  SLP led pt through Goal Attainment Scaling addressing Roy Carr's progress made in therapy, and perception of where he is now on a scale of 1-10 in relation to goals set at the beginning of therpay. Reviewed how goal was characterized. Pt's mother feels Roy Carr has met his goal of an "8" (see above) and continues to endorse improvements noted outside of therapy sessions. SLP A mother in determining "9" using goal attainment scaling (see above). SLP initiated structured task targeting /r/ in initial, medal, and final word positions. Focused on quality, frequent repetitions for increased accuracy. Pt benefits from visual and verbal cues for articulatory placement and modeling of desired target. Intermittent demonstration of carryover of slow rate without cues resulting in overall increased intelligibility.   06/06/23: SLP led pt through structured task targeting /l/ in initial and medal word positions. Focused on quality, frequent repetitions for increased accuracy. Pt benefits from visual and verbal cues for articulatory placement and modeling of desired target. Accuracy noted to improve as session progressed with few instances of self-correction. Occasional adding of extra syllable which requires max-A to correct. Throughout session, continued to cue for slow rate to enhance overall intelligibility with pt demonstrating returns 90% of opportunities. Plan to review Goal Attainment Scaling next session.   05/30/23: SLP provided pt and family with handout outlining error'd productions from last week's completion of GFTA-3. Discussed generating personally salient target words for practice, planning to implement modified cycles approach. Pt participated in structured practice this date with focus on /l/ and /l/ blends during themed activity. Ongoing modeling provided, though pt demonstrating improvement with regards to efficacy of cues and decreased cues required for obtaining  close approxmiation of targets initially in error. Ongoing verbal cues and modeling for utilizing slow rate for overall increased intelligibility.   05/23/23: The Goldman-Fristoe Test of Articulation-3 (GFTA-3) was informally administered as an assessment of Roy Carr's articulation of consonant sounds at word level. During the GFTA-3, Roy Carr spontaneously or imitatively produces a single-word label after looking at pictures. Performance on this measure aides in providing additional information about Roy Carr's speech sound errors to inform therapeutic targets. Roy Carr primarily demonstrated initial consonant errors, however some of the initial errors produced were inconsistent. Inconsistent initial errors include /p/, /h/, /t/, and /k/, inconsistent medial errors include /b/, /g/ and /ng/, and inconsistent final errors included /g/ and /r/. Pt also occasionally added unnecessary sounds to words included "pisuh" onto the beginning of "spider" and "uh" to the end of "web." Pt was stimulable for deletion of added sounds, and correct productions of initial sounds /kw/, /sh/, /dr/, /sl/, /sw/, /fr/, /t/,/bl/, /  j/, /z/, medial sounds, /v/, and final sounds /r/ and /g/.  ST plans to target consistent errors and errors pt continues to be stimulable for.  05/16/23: SLP targeted intelligibility using picture stimuli to facilitate discussion, identify personally relevant words, and target sounds deemed challenging for Roy Carr including initial /v/, /l/, /r/ and medial /l/. Pt continues to benefit from min/moderate visual and verbal articulation cues resulting in improved accuracy of error 'd productions and challenging consonants/consonant clusters. SLP provided visual feedback to aid the pt's articulator placement and cued for reduced rate to improve fluency and accuracy with multi syllabic words.  SLP unable to obtain speech assessment for use in this session, but plans to implement next visit.   PATIENT EDUCATION: Education  details: see above Person educated: Patient and Parent Education method: Medical illustrator Education comprehension: verbalized understanding, returned demonstration, verbal cues required, and needs further education  HOME EXERCISE PROGRAM: Caregiver cues/modeling, practice with personally relevant, salient words    GOALS: Goals reviewed with patient? Yes  SHORT TERM GOALS: Target date: 05/02/2023  Pt will utilize strategies to slow rate of speech with usual mod-A at sentence level  Baseline: variable rate, increased rate decreases intelligibility.  Goal status: MET  2.  Pt will produce consonant clusters with 50% accuracy at word level during structured practice  Baseline: 25% accuracy Goal status: MET  3.  Pt will produce t, th, y in all positions with 50% accuracy at word level during structured practice  Baseline: 25% accuracy, is stimulible for targeted speech sounds  Goal status: deferred -- pt benefiting from more informal approach targeting overall improved intelligibility   4.  Pt will complete HEP BID 6/7 days week Baseline: not established  Goal status: MET   LONG TERM GOALS: Target date:  08/08/2023 (recert- goals updated)  Mom will rate pt's speech as 8/10 via goal attainment scaling Baseline: 7, see above Goal status: MET  2.  Pt will correct error'd sounds in 50% of opportunities at phrase level during structured practice  Baseline: attempts correction with support; correcting at word level  and occasionally in conversation (06/13/2023) Goal status: ONGOING  3.  Pt will be 80% intelligible in question responses Baseline: 75% intelligible; improved utilization of strategies with decreased cueing required (06/13/2023) Goal status: MET  4.   Mom will rate pt's speech as 9/10 via goal attainment scaling by d/c  Baseline: 8/10 (see above)   Goal status: new at recert  5.   Pt will attempt self-correction for implementation of intelligibility  strategies verbal cues only, no direct model   Baseline: provision of direct model for speech with decreased clarity  Goal status: new at recert   ASSESSMENT:  CLINICAL IMPRESSION: Patient is a 38 y.o. M who was seen today for ST session targeting improved intelligibility via reducing rate and shaping of error'd productions of variety of consonants. Therapy is focusing on highly salient and personally relevant stimuli for improved motivation and engagement throughout sessions. Pt's mother reports carryover of treatment strategies at home and ongoing motivation for addressing motor speech at home. Pt requires cueing for increased frequency of accurate productions of personally relevant words including family members and work responsibilities. Pt would benefit from skilled ST to address aforementioned deficits to enhance communication efficacy.    OBJECTIVE IMPAIRMENTS: Objective impairments include  articulation disorder . These impairments are limiting patient from effectively communicating at home and in community.Factors affecting potential to achieve goals and functional outcome are previous level of function.. Patient  will benefit from skilled SLP services to address above impairments and improve overall function.  REHAB POTENTIAL: Good  PLAN:  SLP FREQUENCY: 1-2x/week  SLP DURATION: 12 weeks  PLANNED INTERVENTIONS: 92522- Speech Eval Sound Prod, Articulate, Phonological, 16109 Treatment of speech (30 or 45 min) , Multimodal communication approach, SLP instruction and feedback, Compensatory strategies, and Patient/family education   Roylene Reason, Student-SLP 06/20/2023, 8:46 AM  Check all possible CPT codes: See Planned Interventions List for Planned CPT Codes    Check all conditions that are expected to impact treatment: Cognitive Impairment or Intellectual disability   If treatment provided at initial evaluation, no treatment charged due to lack of authorization.

## 2023-06-27 ENCOUNTER — Ambulatory Visit: Payer: Self-pay | Admitting: Speech Pathology

## 2023-06-27 DIAGNOSIS — F800 Phonological disorder: Secondary | ICD-10-CM | POA: Diagnosis not present

## 2023-06-27 NOTE — Therapy (Signed)
 OUTPATIENT SPEECH LANGUAGE PATHOLOGY TREATMENT NOTE (RECERTIFICATION)   Patient Name: Roy Carr MRN: [id number] DOB:[birth year], 38 y.o., male Today's Date: 06/27/2023  PCP: Roy Headings, MD  REFERRING PROVIDER: Madelin Headings, MD  END OF SESSION:  End of Session - 06/27/23 0841     Visit Number 15    Number of Visits 21    Date for SLP Re-Evaluation 08/08/23    SLP Start Time 0845    SLP Stop Time  0930    SLP Time Calculation (min) 45 min    Activity Tolerance Patient tolerated treatment well              Past Medical History:  Diagnosis Date   Hypothyroid    prev per Dr Fransico Michael    Personal history of spine surgery    Spondylisthesis    grade 2 corrected   Trisomy 21    Varicose veins    intervention 5 2011   Past Surgical History:  Procedure Laterality Date   ABLATION SAPHENOUS VEIN W/ RFA     SPINE SURGERY     WISDOM TOOTH EXTRACTION     Patient Active Problem List   Diagnosis Date Noted   Elevated MCV 08/18/2017   Rash and nonspecific skin eruption 07/08/2013   Low HDL (under 40) 07/08/2013   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   Visit for preventive health examination 07/03/2012   Early cataracts, bilateral 07/03/2012   Personal history of spine surgery    Seborrheic dermatitis of scalp 06/23/2010   Trisomy 21    Spondylolisthesis    Hypothyroidism 06/16/2009   VARICOSE VEINS, LOWER EXTREMITIES 06/16/2009   XERODERMA 06/16/2009    ONSET DATE: referral 02/26/23  REFERRING DIAG: Q90.9 (ICD-10-CM) - Trisomy 21   THERAPY DIAG:  Articulation disorder  Rationale for Evaluation and Treatment: Habilitation  SUBJECTIVE:   SUBJECTIVE STATEMENT: Pt presenting with lower mood today than usual, easily frustrated during session but able to participate effectively overall.  PERTINENT HISTORY: Trisomy 64, previously received extensive ST to address communication, has not in many years  PAIN:  Are you having pain? No  FALLS: Has patient fallen in  last 6 months?  No  LIVING ENVIRONMENT: Lives with: lives with their family Lives in: House/apartment  PLOF:  Level of assistance: Needed assistance with ADLs, Needed assistance with IADLS Employment: Full-time employment  PATIENT GOALS: to see if speech would be beneficial at this time to increase intelligibility   OBJECTIVE:  Note: Objective measures were completed at Evaluation unless otherwise noted.  GFTA Errors: Initial Medial Final  kw l r  dr f t  pl ng th  pr g g  sh v   sl r   g v   gl b   th    v    f    l    r    bl    br    tr    z    sw    p    h    k    st    sp    gr    j      PATIENT REPORTED OUTCOME MEASURES (PROM): Goal Attainment Scaling: Given a scale of 1-10, with 1 being unable to communicate, 10 being ideal communication abilities, where is Roy Carr at now? 7 What characterizes a 7? Is communicating, is able to be understood by familiar listeners with context, challenges without context, strangers or community members unable to understand, mom  has challenges understanding at times which causes frustration, Roy Carr is trying to use clear speech and repeat when model is provided  What would an 8 look like? Improved ability to slow down, able to make small talk with community people, is practicing speech 8: Improved ability to slow down, able to make small talk with community people, is practicing speech, initiating interactions  What would a 9 look like? Less prompting for slowing down, increased self correction  TODAY'S TREATMENT: 06/27/23: SLP targeted s blends: st, sl, sw, and sp in today's session. SLP initiated review of personally relevant/functional words, then lead pt through a structured game providing direct modeling, auditory bombardment, articulation cues and visual feedback to support pt's success. Pt was responsive to SLP cues for accurate productions ~30% of the time during today's session, pt occasionally became frustrated with  requests for additional productions requiring additional support and some redirection to participate. Frequent cluster reduction with s blends and pt occasionally adding additional syllables to words. Pt able to eliminate unnecessary syllables added and produce words with improved accuracy benefiting from visual placement cues in ~50% of opportunities when pt agreeable and willing to participate.   06/20/23: Focused session on production of "th" in all word positions. SLP provided direct modeling, articulation cues, and visual feedback for increased accuracy of target. Usual substitution of /f/ for "th" target. Responsive to SLP cues for improved accuracy in ~40% of opportunities, with cues decreasing as session progressed. Mother ID x5 highly salient targets for this phoneme. Will continue to target.   06/13/23:  SLP led pt through Goal Attainment Scaling addressing Roy Carr's progress made in therapy, and perception of where he is now on a scale of 1-10 in relation to goals set at the beginning of therpay. Reviewed how goal was characterized. Pt's mother feels Roy Carr has met his goal of an "8" (see above) and continues to endorse improvements noted outside of therapy sessions. SLP A mother in determining "9" using goal attainment scaling (see above). SLP initiated structured task targeting /r/ in initial, medal, and final word positions. Focused on quality, frequent repetitions for increased accuracy. Pt benefits from visual and verbal cues for articulatory placement and modeling of desired target. Intermittent demonstration of carryover of slow rate without cues resulting in overall increased intelligibility.   06/06/23: SLP led pt through structured task targeting /l/ in initial and medal word positions. Focused on quality, frequent repetitions for increased accuracy. Pt benefits from visual and verbal cues for articulatory placement and modeling of desired target. Accuracy noted to improve as session progressed  with few instances of self-correction. Occasional adding of extra syllable which requires max-A to correct. Throughout session, continued to cue for slow rate to enhance overall intelligibility with pt demonstrating returns 90% of opportunities. Plan to review Goal Attainment Scaling next session.   05/30/23: SLP provided pt and family with handout outlining error'd productions from last week's completion of GFTA-3. Discussed generating personally salient target words for practice, planning to implement modified cycles approach. Pt participated in structured practice this date with focus on /l/ and /l/ blends during themed activity. Ongoing modeling provided, though pt demonstrating improvement with regards to efficacy of cues and decreased cues required for obtaining close approxmiation of targets initially in error. Ongoing verbal cues and modeling for utilizing slow rate for overall increased intelligibility.   05/23/23: The Goldman-Fristoe Test of Articulation-3 (GFTA-3) was informally administered as an assessment of Roy Carr's articulation of consonant sounds at word level. During the GFTA-3, Roy Carr spontaneously  or imitatively produces a single-word label after looking at pictures. Performance on this measure aides in providing additional information about Roy Carr's speech sound errors to inform therapeutic targets. Roy Carr primarily demonstrated initial consonant errors, however some of the initial errors produced were inconsistent. Inconsistent initial errors include /p/, /h/, /t/, and /k/, inconsistent medial errors include /b/, /g/ and /ng/, and inconsistent final errors included /g/ and /r/. Pt also occasionally added unnecessary sounds to words included "pisuh" onto the beginning of "spider" and "uh" to the end of "web." Pt was stimulable for deletion of added sounds, and correct productions of initial sounds /kw/, /sh/, /dr/, /sl/, /sw/, /fr/, /t/,/bl/, /j/, /z/, medial sounds, /v/, and final sounds /r/ and  /g/.  ST plans to target consistent errors and errors pt continues to be stimulable for.  05/16/23: SLP targeted intelligibility using picture stimuli to facilitate discussion, identify personally relevant words, and target sounds deemed challenging for Roy Carr including initial /v/, /l/, /r/ and medial /l/. Pt continues to benefit from min/moderate visual and verbal articulation cues resulting in improved accuracy of error 'd productions and challenging consonants/consonant clusters. SLP provided visual feedback to aid the pt's articulator placement and cued for reduced rate to improve fluency and accuracy with multi syllabic words.  SLP unable to obtain speech assessment for use in this session, but plans to implement next visit.   PATIENT EDUCATION: Education details: see above Person educated: Patient and Parent Education method: Medical illustrator Education comprehension: verbalized understanding, returned demonstration, verbal cues required, and needs further education  HOME EXERCISE PROGRAM: Caregiver cues/modeling, practice with personally relevant, salient words    GOALS: Goals reviewed with patient? Yes  SHORT TERM GOALS: Target date: 05/02/2023  Pt will utilize strategies to slow rate of speech with usual mod-A at sentence level  Baseline: variable rate, increased rate decreases intelligibility.  Goal status: MET  2.  Pt will produce consonant clusters with 50% accuracy at word level during structured practice  Baseline: 25% accuracy Goal status: MET  3.  Pt will produce t, th, y in all positions with 50% accuracy at word level during structured practice  Baseline: 25% accuracy, is stimulible for targeted speech sounds  Goal status: deferred -- pt benefiting from more informal approach targeting overall improved intelligibility   4.  Pt will complete HEP BID 6/7 days week Baseline: not established  Goal status: MET   LONG TERM GOALS: Target date:  08/08/2023  (recert- goals updated)  Mom will rate pt's speech as 8/10 via goal attainment scaling Baseline: 7, see above Goal status: MET  2.  Pt will correct error'd sounds in 50% of opportunities at phrase level during structured practice  Baseline: attempts correction with support; correcting at word level  and occasionally in conversation (06/13/2023) Goal status: ONGOING  3.  Pt will be 80% intelligible in question responses Baseline: 75% intelligible; improved utilization of strategies with decreased cueing required (06/13/2023) Goal status: MET  4.   Mom will rate pt's speech as 9/10 via goal attainment scaling by d/c  Baseline: 8/10 (see above)   Goal status: new at recert  5.   Pt will attempt self-correction for implementation of intelligibility strategies verbal cues only, no direct model   Baseline: provision of direct model for speech with decreased clarity  Goal status: new at recert   ASSESSMENT:  CLINICAL IMPRESSION: Patient is a 38 y.o. M who was seen today for ST session targeting improved intelligibility via reducing rate and shaping of error'd productions  of variety of consonants. Therapy is focusing on highly salient and personally relevant stimuli for improved motivation and engagement throughout sessions. Pt's mother reports carryover of treatment strategies at home and ongoing motivation for addressing motor speech at home. Pt requires cueing for increased frequency of accurate productions of personally relevant words including family members and work responsibilities. Pt would benefit from skilled ST to address aforementioned deficits to enhance communication efficacy.    OBJECTIVE IMPAIRMENTS: Objective impairments include  articulation disorder . These impairments are limiting patient from effectively communicating at home and in community.Factors affecting potential to achieve goals and functional outcome are previous level of function.. Patient will benefit from skilled  SLP services to address above impairments and improve overall function.  REHAB POTENTIAL: Good  PLAN:  SLP FREQUENCY: 1-2x/week  SLP DURATION: 12 weeks  PLANNED INTERVENTIONS: 92522- Speech Eval Sound Prod, Articulate, Phonological, 16109 Treatment of speech (30 or 45 min) , Multimodal communication approach, SLP instruction and feedback, Compensatory strategies, and Patient/family education   Roylene Reason, Student-SLP 06/27/2023, 8:42 AM  Check all possible CPT codes: See Planned Interventions List for Planned CPT Codes    Check all conditions that are expected to impact treatment: Cognitive Impairment or Intellectual disability   If treatment provided at initial evaluation, no treatment charged due to lack of authorization.

## 2023-07-04 ENCOUNTER — Ambulatory Visit: Payer: Self-pay | Admitting: Speech Pathology

## 2023-07-04 DIAGNOSIS — F800 Phonological disorder: Secondary | ICD-10-CM

## 2023-07-04 NOTE — Therapy (Signed)
 OUTPATIENT SPEECH LANGUAGE PATHOLOGY TREATMENT NOTE (RECERTIFICATION)   Patient Name: Roy Carr MRN: [id number] DOB:[birth year], 38 y.o., male Today's Date: 07/04/2023  PCP: Madelin Headings, MD  REFERRING PROVIDER: Madelin Headings, MD  END OF SESSION:  End of Session - 07/04/23 0848     Visit Number 16    Number of Visits 21    Date for SLP Re-Evaluation 08/08/23    SLP Start Time 0848    SLP Stop Time  0930    SLP Time Calculation (min) 42 min    Activity Tolerance Patient tolerated treatment well             Past Medical History:  Diagnosis Date   Hypothyroid    prev per Dr Fransico Michael    Personal history of spine surgery    Spondylisthesis    grade 2 corrected   Trisomy 21    Varicose veins    intervention 5 2011   Past Surgical History:  Procedure Laterality Date   ABLATION SAPHENOUS VEIN W/ RFA     SPINE SURGERY     WISDOM TOOTH EXTRACTION     Patient Active Problem List   Diagnosis Date Noted   Elevated MCV 08/18/2017   Rash and nonspecific skin eruption 07/08/2013   Low HDL (under 40) 07/08/2013   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   Visit for preventive health examination 07/03/2012   Early cataracts, bilateral 07/03/2012   Personal history of spine surgery    Seborrheic dermatitis of scalp 06/23/2010   Trisomy 21    Spondylolisthesis    Hypothyroidism 06/16/2009   VARICOSE VEINS, LOWER EXTREMITIES 06/16/2009   XERODERMA 06/16/2009    ONSET DATE: referral 02/26/23  REFERRING DIAG: Q90.9 (ICD-10-CM) - Trisomy 21   THERAPY DIAG:  Articulation disorder  Rationale for Evaluation and Treatment: Habilitation  SUBJECTIVE:   SUBJECTIVE STATEMENT: Pt engaged throughout today's sessions, pt appeared to enjoy dynamic activity utilized during today's session.   PERTINENT HISTORY: Trisomy 31, previously received extensive ST to address communication, has not in many years  PAIN:  Are you having pain? No  FALLS: Has patient fallen in last 6 months?   No  LIVING ENVIRONMENT: Lives with: lives with their family Lives in: House/apartment  PLOF:  Level of assistance: Needed assistance with ADLs, Needed assistance with IADLS Employment: Full-time employment  PATIENT GOALS: to see if speech would be beneficial at this time to increase intelligibility   OBJECTIVE:  Note: Objective measures were completed at Evaluation unless otherwise noted.  GFTA Errors: Initial Medial Final  kw l r  dr f t  pl ng th  pr g g  sh v   sl r   g v   gl b   th    v    f    l    r    bl    br    tr    z    sw    p    h    k    st    sp    gr    j      PATIENT REPORTED OUTCOME MEASURES (PROM): Goal Attainment Scaling: Given a scale of 1-10, with 1 being unable to communicate, 10 being ideal communication abilities, where is [name] at now? 7 What characterizes a 7? Is communicating, is able to be understood by familiar listeners with context, challenges without context, strangers or community members unable to understand, mom has challenges understanding  at times which causes frustration, [name] is trying to use clear speech and repeat when model is provided  What would an 8 look like? Improved ability to slow down, able to make small talk with community people, is practicing speech 8: Improved ability to slow down, able to make small talk with community people, is practicing speech, initiating interactions  What would a 9 look like? Less prompting for slowing down, increased self correction  TODAY'S TREATMENT  07/04/23: SLP targeted stopping of initial voiced and voiceless labiodental fricatives this session. Initiated review of functional words, then lead pt through structured game reinforcing proper articulatory placement for improved intelligibility via direct modeling, auditory bombardment, articulation cues and visual feedback. Pt participated effectively benefiting from min/mod cues to correct incorrect productions in ~40% of  opportunities. Pt benefiting from fading cues with more stimulable words (ex. family, fast, voice, visit). He required additional cuing to accurately produce more challenging multi syllabic words (vegetable, vanilla, vacuum etc.). Picture cards provided for carryover and additional practice at home.   PATIENT EDUCATION: Education details: see above Person educated: Patient and Parent Education method: Medical illustrator Education comprehension: verbalized understanding, returned demonstration, verbal cues required, and needs further education  HOME EXERCISE PROGRAM: Caregiver cues/modeling, practice with personally relevant, salient words    GOALS: Goals reviewed with patient? Yes  SHORT TERM GOALS: Target date: 05/02/2023  Pt will utilize strategies to slow rate of speech with usual mod-A at sentence level  Baseline: variable rate, increased rate decreases intelligibility.  Goal status: MET  2.  Pt will produce consonant clusters with 50% accuracy at word level during structured practice  Baseline: 25% accuracy Goal status: MET  3.  Pt will produce t, th, y in all positions with 50% accuracy at word level during structured practice  Baseline: 25% accuracy, is stimulible for targeted speech sounds  Goal status: deferred -- pt benefiting from more informal approach targeting overall improved intelligibility   4.  Pt will complete HEP BID 6/7 days week Baseline: not established  Goal status: MET   LONG TERM GOALS: Target date:  08/08/2023 (recert- goals updated)  Mom will rate pt's speech as 8/10 via goal attainment scaling Baseline: 7, see above Goal status: MET  2.  Pt will correct error'd sounds in 50% of opportunities at phrase level during structured practice  Baseline: attempts correction with support; correcting at word level  and occasionally in conversation (06/13/2023) Goal status: ONGOING  3.  Pt will be 80% intelligible in question responses Baseline:  75% intelligible; improved utilization of strategies with decreased cueing required (06/13/2023) Goal status: MET  4.   Mom will rate pt's speech as 9/10 via goal attainment scaling by d/c  Baseline: 8/10 (see above)   Goal status: new at recert  5.   Pt will attempt self-correction for implementation of intelligibility strategies verbal cues only, no direct model   Baseline: provision of direct model for speech with decreased clarity  Goal status: new at recert   ASSESSMENT:  CLINICAL IMPRESSION: Patient is a 38 y.o. M who was seen today for ST session targeting improved intelligibility via reducing rate and shaping of error'd productions of variety of consonants. Therapy is focusing on highly salient and personally relevant stimuli for improved motivation and engagement throughout sessions. Pt's mother reports carryover of treatment strategies at home and ongoing motivation for addressing motor speech at home. Pt requires cueing for increased frequency of accurate productions of personally relevant words including family members and work  responsibilities. Pt would benefit from skilled ST to address aforementioned deficits to enhance communication efficacy.    OBJECTIVE IMPAIRMENTS: Objective impairments include  articulation disorder . These impairments are limiting patient from effectively communicating at home and in community.Factors affecting potential to achieve goals and functional outcome are previous level of function.. Patient will benefit from skilled SLP services to address above impairments and improve overall function.  REHAB POTENTIAL: Good  PLAN:  SLP FREQUENCY: 1-2x/week  SLP DURATION: 12 weeks  PLANNED INTERVENTIONS: 92522- Speech Eval Sound Prod, Articulate, Phonological, 16109 Treatment of speech (30 or 45 min) , Multimodal communication approach, SLP instruction and feedback, Compensatory strategies, and Patient/family education   Roylene Reason,  Student-SLP 07/04/2023, 8:49 AM  Check all possible CPT codes: See Planned Interventions List for Planned CPT Codes    Check all conditions that are expected to impact treatment: Cognitive Impairment or Intellectual disability   If treatment provided at initial evaluation, no treatment charged due to lack of authorization.

## 2023-07-11 ENCOUNTER — Ambulatory Visit: Payer: Self-pay | Admitting: Speech Pathology

## 2023-07-11 DIAGNOSIS — F800 Phonological disorder: Secondary | ICD-10-CM | POA: Diagnosis not present

## 2023-07-11 NOTE — Therapy (Signed)
 OUTPATIENT SPEECH LANGUAGE PATHOLOGY TREATMENT NOTE (RECERTIFICATION)   Patient Name: Roy Carr MRN: [id number] DOB:01-Jul-1985, 38 y.o., male Today's Date: 07/11/2023  PCP: Madelin Headings, MD  REFERRING PROVIDER: Madelin Headings, MD  END OF SESSION:  End of Session - 07/11/23 0845     Visit Number 17    Number of Visits 21    Date for SLP Re-Evaluation 08/08/23    SLP Start Time 0845    SLP Stop Time  0930    SLP Time Calculation (min) 45 min    Activity Tolerance Patient tolerated treatment well              Past Medical History:  Diagnosis Date   Hypothyroid    prev per Dr Fransico Michael    Personal history of spine surgery    Spondylisthesis    grade 2 corrected   Trisomy 21    Varicose veins    intervention 5 2011   Past Surgical History:  Procedure Laterality Date   ABLATION SAPHENOUS VEIN W/ RFA     SPINE SURGERY     WISDOM TOOTH EXTRACTION     Patient Active Problem List   Diagnosis Date Noted   Elevated MCV 08/18/2017   Rash and nonspecific skin eruption 07/08/2013   Low HDL (under 40) 07/08/2013   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    PERTINENT HISTORY: Trisomy 8, previously received extensive ST to address communication, has not in many years  PAIN:  Are you having pain? No  FALLS: Has patient fallen in last 6 months?  No  LIVING ENVIRONMENT: Lives with: lives with their  family Lives in: House/apartment  PLOF:  Level of assistance: Needed assistance with ADLs, Needed assistance with IADLS Employment: Full-time employment  PATIENT GOALS: to see if speech would be beneficial at this time to increase intelligibility   OBJECTIVE:  Note: Objective measures were completed at Evaluation unless otherwise noted.  GFTA Errors: Initial Medial Final  kw l r  dr f t  pl ng th  pr g g  sh v   sl r   g v   gl b   th    v    f    l    r    bl    br    tr    z    sw    p    h    k    st    sp    gr    j      PATIENT REPORTED OUTCOME MEASURES (PROM): Goal Attainment Scaling: Given a scale of 1-10, with 1 being unable to communicate, 10 being ideal communication abilities, where is Roy Carr at now? 7 What characterizes a 7? Is communicating, is able to be understood by familiar listeners with context, challenges without context, strangers or community members unable to understand, mom has challenges understanding at times which causes frustration, Roy Carr  is trying to use clear speech and repeat when model is provided  What would an 8 look like? Improved ability to slow down, able to make small talk with community people, is practicing speech 8: Improved ability to slow down, able to make small talk with community people, is practicing speech, initiating interactions  What would a 9 look like? Less prompting for slowing down, increased self correction  TODAY'S TREATMENT  07/11/23: Addressed nasals in variety of word positions with pt demonstrating good accuracy with occasional need for verbal cues and model. Went through prior targets of challenging stimuli with pt demonstrating persistent errors with "th", /l/ blends, medial and final /l/.  Improvements noted with S blends particularly with /st, sw/.  With erroneous productions patient is producing two sounds to mark blend.  07/04/23: SLP targeted stopping of initial voiced and voiceless labiodental  fricatives this session. Initiated review of functional words, then lead pt through structured game reinforcing proper articulatory placement for improved intelligibility via direct modeling, auditory bombardment, articulation cues and visual feedback. Pt participated effectively benefiting from min/mod cues to correct incorrect productions in ~40% of opportunities. Pt benefiting from fading cues with more stimulable words (ex. family, fast, voice, visit). He required additional cuing to accurately produce more challenging multi syllabic words (vegetable, vanilla, vacuum etc.). Picture cards provided for carryover and additional practice at home.   PATIENT EDUCATION: Education details: see above Person educated: Patient and Parent Education method: Medical illustrator Education comprehension: verbalized understanding, returned demonstration, verbal cues required, and needs further education  HOME EXERCISE PROGRAM: Caregiver cues/modeling, practice with personally relevant, salient words    GOALS: Goals reviewed with patient? Yes  SHORT TERM GOALS: Target date: 05/02/2023  Pt will utilize strategies to slow rate of speech with usual mod-A at sentence level  Baseline: variable rate, increased rate decreases intelligibility.  Goal status: MET  2.  Pt will produce consonant clusters with 50% accuracy at word level during structured practice  Baseline: 25% accuracy Goal status: MET  3.  Pt will produce t, th, y in all positions with 50% accuracy at word level during structured practice  Baseline: 25% accuracy, is stimulible for targeted speech sounds  Goal status: deferred -- pt benefiting from more informal approach targeting overall improved intelligibility   4.  Pt will complete HEP BID 6/7 days week Baseline: not established  Goal status: MET   LONG TERM GOALS: Target date:  08/08/2023 (recert- goals updated)  Mom will rate pt's speech as 8/10 via goal attainment  scaling Baseline: 7, see above Goal status: MET  2.  Pt will correct error'd sounds in 50% of opportunities at phrase level during structured practice  Baseline: attempts correction with support; correcting at word level  and occasionally in conversation (06/13/2023) Goal status: ONGOING  3.  Pt will be 80% intelligible in question responses Baseline: 75% intelligible; improved utilization of strategies with decreased cueing required (06/13/2023) Goal status: MET  4.   Mom will rate pt's speech as 9/10 via goal attainment scaling by d/c  Baseline: 8/10 (see above)   Goal status: new at recert  5.   Pt will attempt self-correction for implementation of intelligibility strategies verbal cues only, no direct model   Baseline: provision of direct model for speech with decreased clarity  Goal status: new at recert   ASSESSMENT:  CLINICAL IMPRESSION: Patient is a 38 y.o. M who was seen today for ST session targeting improved intelligibility via reducing rate and shaping of error'd  productions of variety of consonants. Therapy is focusing on highly salient and personally relevant stimuli for improved motivation and engagement throughout sessions. Pt's mother reports carryover of treatment strategies at home and ongoing motivation for addressing motor speech at home. Pt requires cueing for increased frequency of accurate productions of personally relevant words including family members and work responsibilities. Pt would benefit from skilled ST to address aforementioned deficits to enhance communication efficacy.    OBJECTIVE IMPAIRMENTS: Objective impairments include  articulation disorder . These impairments are limiting patient from effectively communicating at home and in community.Factors affecting potential to achieve goals and functional outcome are previous level of function.. Patient will benefit from skilled SLP services to address above impairments and improve overall function.  REHAB  POTENTIAL: Good  PLAN:  SLP FREQUENCY: 1-2x/week  SLP DURATION: 12 weeks  PLANNED INTERVENTIONS: 92522- Speech Eval Sound Prod, Articulate, Phonological, 62130 Treatment of speech (30 or 45 min) , Multimodal communication approach, SLP instruction and feedback, Compensatory strategies, and Patient/family education   Roylene Reason, Student-SLP 07/11/2023, 8:45 AM  Check all possible CPT codes: See Planned Interventions List for Planned CPT Codes    Check all conditions that are expected to impact treatment: Cognitive Impairment or Intellectual disability   If treatment provided at initial evaluation, no treatment charged due to lack of authorization.

## 2023-07-18 ENCOUNTER — Ambulatory Visit: Payer: Self-pay | Attending: Internal Medicine | Admitting: Speech Pathology

## 2023-07-18 DIAGNOSIS — F800 Phonological disorder: Secondary | ICD-10-CM | POA: Diagnosis not present

## 2023-07-18 NOTE — Therapy (Signed)
 OUTPATIENT SPEECH LANGUAGE PATHOLOGY TREATMENT NOTE (RECERTIFICATION)   Patient Name: Roy Carr MRN: [id number] DOB:May 24, 1985, 38 y.o., male Today's Date: 07/18/2023  PCP: Madelin Headings, MD  REFERRING PROVIDER: Madelin Headings, MD  END OF SESSION:  End of Session - 07/18/23 0839     Visit Number 18    Number of Visits 21    Date for SLP Re-Evaluation 08/08/23    SLP Start Time 0845    SLP Stop Time  0930    SLP Time Calculation (min) 45 min    Activity Tolerance Patient tolerated treatment well               Past Medical History:  Diagnosis Date   Hypothyroid    prev per Dr Fransico Michael    Personal history of spine surgery    Spondylisthesis    grade 2 corrected   Trisomy 21    Varicose veins    intervention 5 2011   Past Surgical History:  Procedure Laterality Date   ABLATION SAPHENOUS VEIN W/ RFA     SPINE SURGERY     WISDOM TOOTH EXTRACTION     Patient Active Problem List   Diagnosis Date Noted   Elevated MCV 08/18/2017   Rash and nonspecific skin eruption 07/08/2013   Low HDL (under 40) 07/08/2013   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 Mother continues to endorse perceived benefit for [facility]. She reports overall increased communication attempts on behalf of [name], improved intelligibility, even on "tricky" words.   PERTINENT HISTORY: Trisomy 34, previously received extensive  ST to address communication, has not in many years  PAIN:  Are you having pain? No  FALLS: Has patient fallen in last 6 months?  No  LIVING ENVIRONMENT: Lives with: lives with their family Lives in: House/apartment  PLOF:  Level of assistance: Needed assistance with ADLs, Needed assistance with IADLS Employment: Full-time employment  PATIENT GOALS: to see if speech would be beneficial at this time to increase intelligibility   OBJECTIVE:  Note: Objective measures were completed at Evaluation unless otherwise noted.  GFTA Errors: Initial Medial Final  kw l r  dr f t  pl ng th  pr g g  sh v   sl r   g v   gl b   th    v    f    l    r    bl    br    tr    z    sw    p    h    k    st    sp    gr    j      PATIENT REPORTED OUTCOME MEASURES (PROM): Goal Attainment Scaling: Given a scale of 1-10, with 1 being unable to communicate, 10 being ideal communication abilities, where is [name] at now? 7 What characterizes a 7? Is communicating, is able to  be understood by familiar listeners with context, challenges without context, strangers or community members unable to understand, mom has challenges understanding at times which causes frustration, [name] is trying to use clear speech and repeat when model is provided  What would an 8 look like? Improved ability to slow down, able to make small talk with community people, is practicing speech 8: Improved ability to slow down, able to make small talk with community people, is practicing speech, initiating interactions  What would a 9 look like? Less prompting for slowing down, increased self correction  TODAY'S TREATMENT  07/18/23: Targeted initial /l/ blends in today's session. Initiated review of functional words, then facilitated structured game reinforcing proper articulatory placement for improved intelligibility via direct modeling, auditory bombardment, articulation cues and visual feedback. Pt demonstrating  accuracy given moderate support for blend production, errors persist with gliding for /l/ in increased complex targets.   07/11/23: Addressed nasals in variety of word positions with pt demonstrating good accuracy with occasional need for verbal cues and model. Went through prior targets of challenging stimuli with pt demonstrating persistent errors with "th", /l/ blends, medial and final /l/.  Improvements noted with S blends particularly with /st, sw/.  With erroneous productions patient is producing two sounds to mark blend.  07/04/23: SLP targeted stopping of initial voiced and voiceless labiodental fricatives this session. Initiated review of functional words, then lead pt through structured game reinforcing proper articulatory placement for improved intelligibility via direct modeling, auditory bombardment, articulation cues and visual feedback. Pt participated effectively benefiting from min/mod cues to correct incorrect productions in ~40% of opportunities. Pt benefiting from fading cues with more stimulable words (ex. family, fast, voice, visit). He required additional cuing to accurately produce more challenging multi syllabic words (vegetable, vanilla, vacuum etc.). Picture cards provided for carryover and additional practice at home.   PATIENT EDUCATION: Education details: see above Person educated: Patient and Parent Education method: Medical illustrator Education comprehension: verbalized understanding, returned demonstration, verbal cues required, and needs further education  HOME EXERCISE PROGRAM: Caregiver cues/modeling, practice with personally relevant, salient words    GOALS: Goals reviewed with patient? Yes  SHORT TERM GOALS: Target date: 05/02/2023  Pt will utilize strategies to slow rate of speech with usual mod-A at sentence level  Baseline: variable rate, increased rate decreases intelligibility.  Goal status: MET  2.  Pt will produce consonant clusters with  50% accuracy at word level during structured practice  Baseline: 25% accuracy Goal status: MET  3.  Pt will produce t, th, y in all positions with 50% accuracy at word level during structured practice  Baseline: 25% accuracy, is stimulible for targeted speech sounds  Goal status: deferred -- pt benefiting from more informal approach targeting overall improved intelligibility   4.  Pt will complete HEP BID 6/7 days week Baseline: not established  Goal status: MET   LONG TERM GOALS: Target date:  08/08/2023 (recert- goals updated)  Mom will rate pt's speech as 8/10 via goal attainment scaling Baseline: 7, see above Goal status: MET  2.  Pt will correct error'd sounds in 50% of opportunities at phrase level during structured practice  Baseline: attempts correction with support; correcting at word level  and occasionally in conversation (06/13/2023) Goal status: ONGOING  3.  Pt will be 80% intelligible in question responses Baseline: 75% intelligible; improved utilization of strategies with decreased cueing required (06/13/2023) Goal status: MET  4.   Mom will rate pt's speech as 9/10 via goal  attainment scaling by d/c  Baseline: 8/10 (see above)   Goal status: new at recert  5.   Pt will attempt self-correction for implementation of intelligibility strategies verbal cues only, no direct model   Baseline: provision of direct model for speech with decreased clarity  Goal status: new at recert   ASSESSMENT:  CLINICAL IMPRESSION: Patient is a 38 y.o. M who was seen today for ST session targeting improved intelligibility via reducing rate and shaping of error'd productions of variety of consonants. Therapy is focusing on highly salient and personally relevant stimuli for improved motivation and engagement throughout sessions. Pt's mother reports carryover of treatment strategies at home and ongoing motivation for addressing motor speech at home. Pt requires cueing for increased frequency  of accurate productions of personally relevant words including family members and work responsibilities. Pt would benefit from skilled ST to address aforementioned deficits to enhance communication efficacy.    OBJECTIVE IMPAIRMENTS: Objective impairments include  articulation disorder . These impairments are limiting patient from effectively communicating at home and in community.Factors affecting potential to achieve goals and functional outcome are previous level of function.. Patient will benefit from skilled SLP services to address above impairments and improve overall function.  REHAB POTENTIAL: Good  PLAN:  SLP FREQUENCY: 1-2x/week  SLP DURATION: 12 weeks  PLANNED INTERVENTIONS: 92522- Speech Eval Sound Prod, Articulate, Phonological, 54098 Treatment of speech (30 or 45 min) , Multimodal communication approach, SLP instruction and feedback, Compensatory strategies, and Patient/family education   Roylene Reason, Student-SLP 07/18/2023, 8:39 AM  Check all possible CPT codes: See Planned Interventions List for Planned CPT Codes    Check all conditions that are expected to impact treatment: Cognitive Impairment or Intellectual disability   If treatment provided at initial evaluation, no treatment charged due to lack of authorization.

## 2023-07-25 ENCOUNTER — Ambulatory Visit: Payer: Self-pay | Admitting: Speech Pathology

## 2023-07-25 DIAGNOSIS — F800 Phonological disorder: Secondary | ICD-10-CM

## 2023-07-25 NOTE — Therapy (Signed)
 OUTPATIENT SPEECH LANGUAGE PATHOLOGY TREATMENT NOTE    Patient Name: Roy Carr MRN: [id number] DOB:[birth year], 38 y.o., male Today's Date: 07/25/2023  PCP: Madelin Headings, MD  REFERRING PROVIDER: Madelin Headings, MD  END OF SESSION:  End of Session - 07/25/23 0842     Visit Number 19    Number of Visits 21    Date for SLP Re-Evaluation 08/08/23    SLP Start Time 0845    SLP Stop Time  0930    SLP Time Calculation (min) 45 min    Activity Tolerance Patient tolerated treatment well                Past Medical History:  Diagnosis Date   Hypothyroid    prev per Dr Fransico Michael    Personal history of spine surgery    Spondylisthesis    grade 2 corrected   Trisomy 21    Varicose veins    intervention 5 2011   Past Surgical History:  Procedure Laterality Date   ABLATION SAPHENOUS VEIN W/ RFA     SPINE SURGERY     WISDOM TOOTH EXTRACTION     Patient Active Problem List   Diagnosis Date Noted   Elevated MCV 08/18/2017   Rash and nonspecific skin eruption 07/08/2013   Low HDL (under 40) 07/08/2013   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   Visit for preventive health examination 07/03/2012   Early cataracts, bilateral 07/03/2012   Personal history of spine surgery    Seborrheic dermatitis of scalp 06/23/2010   Trisomy 21    Spondylolisthesis    Hypothyroidism 06/16/2009   VARICOSE VEINS, LOWER EXTREMITIES 06/16/2009   XERODERMA 06/16/2009    ONSET DATE: referral 02/26/23  REFERRING DIAG: Q90.9 (ICD-10-CM) - Trisomy 21   THERAPY DIAG:  Articulation disorder  Rationale for Evaluation and Treatment: Habilitation  SUBJECTIVE:   SUBJECTIVE STATEMENT: Mother reports ongoing HEP completion and motivation of pt to complete and improve his speech.   PERTINENT HISTORY: Trisomy 72, previously received extensive ST to address communication, has not in many years  PAIN:  Are you having pain? No  FALLS: Has patient fallen in last 6 months?  No  LIVING  ENVIRONMENT: Lives with: lives with their family Lives in: House/apartment  PLOF:  Level of assistance: Needed assistance with ADLs, Needed assistance with IADLS Employment: Full-time employment  PATIENT GOALS: to see if speech would be beneficial at this time to increase intelligibility   OBJECTIVE:  Note: Objective measures were completed at Evaluation unless otherwise noted.  GFTA Errors: Initial Medial Final  kw l r  dr f t  pl ng th  pr g g  sh v   sl r   g v   gl b   th    v    f    l    r    bl    br    tr    z    sw    p    h    k    st    sp    gr    j      PATIENT REPORTED OUTCOME MEASURES (PROM): Goal Attainment Scaling: Given a scale of 1-10, with 1 being unable to communicate, 10 being ideal communication abilities, where is [name] at now? 7 What characterizes a 7? Is communicating, is able to be understood by familiar listeners with context, challenges without context, strangers or community members unable to understand, mom  has challenges understanding at times which causes frustration, [name] is trying to use clear speech and repeat when model is provided  What would an 8 look like? Improved ability to slow down, able to make small talk with community people, is practicing speech 8: Improved ability to slow down, able to make small talk with community people, is practicing speech, initiating interactions  What would a 9 look like? Less prompting for slowing down, increased self correction  TODAY'S TREATMENT  07/25/23: Addressed initial /r/ blends in today's session with review of functional words. Initiated structured game targeting improved intelligibility and correct articulatory placement with usual model and articulatory placement cues provided. Pt achieving 70% accuracy during initial drill practice, improved with subsequent dynamic activity with same target words. HEP materials provided.   07/18/23: Targeted initial /l/ blends in today's session.  Initiated review of functional words, then facilitated structured game reinforcing proper articulatory placement for improved intelligibility via direct modeling, auditory bombardment, articulation cues and visual feedback. Pt demonstrating accuracy given moderate support for blend production, errors persist with gliding for /l/ in increased complex targets.   07/11/23: Addressed nasals in variety of word positions with pt demonstrating good accuracy with occasional need for verbal cues and model. Went through prior targets of challenging stimuli with pt demonstrating persistent errors with "th", /l/ blends, medial and final /l/.  Improvements noted with S blends particularly with /st, sw/.  With erroneous productions patient is producing two sounds to mark blend.  07/04/23: SLP targeted stopping of initial voiced and voiceless labiodental fricatives this session. Initiated review of functional words, then lead pt through structured game reinforcing proper articulatory placement for improved intelligibility via direct modeling, auditory bombardment, articulation cues and visual feedback. Pt participated effectively benefiting from min/mod cues to correct incorrect productions in ~40% of opportunities. Pt benefiting from fading cues with more stimulable words (ex. family, fast, voice, visit). He required additional cuing to accurately produce more challenging multi syllabic words (vegetable, vanilla, vacuum etc.). Picture cards provided for carryover and additional practice at home.   PATIENT EDUCATION: Education details: see above Person educated: Patient and Parent Education method: Medical illustrator Education comprehension: verbalized understanding, returned demonstration, verbal cues required, and needs further education  HOME EXERCISE PROGRAM: Caregiver cues/modeling, practice with personally relevant, salient words    GOALS: Goals reviewed with patient? Yes  SHORT TERM GOALS:  Target date: 05/02/2023  Pt will utilize strategies to slow rate of speech with usual mod-A at sentence level  Baseline: variable rate, increased rate decreases intelligibility.  Goal status: MET  2.  Pt will produce consonant clusters with 50% accuracy at word level during structured practice  Baseline: 25% accuracy Goal status: MET  3.  Pt will produce t, th, y in all positions with 50% accuracy at word level during structured practice  Baseline: 25% accuracy, is stimulible for targeted speech sounds  Goal status: deferred -- pt benefiting from more informal approach targeting overall improved intelligibility   4.  Pt will complete HEP BID 6/7 days week Baseline: not established  Goal status: MET   LONG TERM GOALS: Target date:  08/08/2023 (recert- goals updated)  Mom will rate pt's speech as 8/10 via goal attainment scaling Baseline: 7, see above Goal status: MET  2.  Pt will correct error'd sounds in 50% of opportunities at phrase level during structured practice  Baseline: attempts correction with support; correcting at word level  and occasionally in conversation (06/13/2023) Goal status: ONGOING  3.  Pt  will be 80% intelligible in question responses Baseline: 75% intelligible; improved utilization of strategies with decreased cueing required (06/13/2023) Goal status: MET  4.   Mom will rate pt's speech as 9/10 via goal attainment scaling by d/c  Baseline: 8/10 (see above)   Goal status: new at recert  5.   Pt will attempt self-correction for implementation of intelligibility strategies verbal cues only, no direct model   Baseline: provision of direct model for speech with decreased clarity  Goal status: new at recert   ASSESSMENT:  CLINICAL IMPRESSION: Patient is a 38 y.o. M who was seen today for ST session targeting improved intelligibility via reducing rate and shaping of error'd productions of variety of consonants. Therapy is focusing on highly salient and  personally relevant stimuli for improved motivation and engagement throughout sessions. Pt's mother reports carryover of treatment strategies at home and ongoing motivation for addressing motor speech at home. Pt requires cueing for increased frequency of accurate productions of personally relevant words including family members and work responsibilities. Pt would benefit from skilled ST to address aforementioned deficits to enhance communication efficacy.    OBJECTIVE IMPAIRMENTS: Objective impairments include  articulation disorder . These impairments are limiting patient from effectively communicating at home and in community.Factors affecting potential to achieve goals and functional outcome are previous level of function.. Patient will benefit from skilled SLP services to address above impairments and improve overall function.  REHAB POTENTIAL: Good  PLAN:  SLP FREQUENCY: 1-2x/week  SLP DURATION: 12 weeks  PLANNED INTERVENTIONS: 92522- Speech Eval Sound Prod, Articulate, Phonological, 34742 Treatment of speech (30 or 45 min) , Multimodal communication approach, SLP instruction and feedback, Compensatory strategies, and Patient/family education   Roylene Reason, Student-SLP 07/25/2023, 8:42 AM  Check all possible CPT codes: See Planned Interventions List for Planned CPT Codes    Check all conditions that are expected to impact treatment: Cognitive Impairment or Intellectual disability   If treatment provided at initial evaluation, no treatment charged due to lack of authorization.

## 2023-08-07 ENCOUNTER — Telehealth: Payer: Self-pay | Admitting: *Deleted

## 2023-08-07 DIAGNOSIS — E786 Lipoprotein deficiency: Secondary | ICD-10-CM

## 2023-08-07 DIAGNOSIS — E039 Hypothyroidism, unspecified: Secondary | ICD-10-CM

## 2023-08-07 DIAGNOSIS — Q909 Down syndrome, unspecified: Secondary | ICD-10-CM

## 2023-08-07 DIAGNOSIS — Z0000 Encounter for general adult medical examination without abnormal findings: Secondary | ICD-10-CM

## 2023-08-07 DIAGNOSIS — R768 Other specified abnormal immunological findings in serum: Secondary | ICD-10-CM

## 2023-08-07 DIAGNOSIS — R718 Other abnormality of red blood cells: Secondary | ICD-10-CM

## 2023-08-07 NOTE — Telephone Encounter (Signed)
 Copied from CRM [id number]. Topic: Appointments - Appointment Scheduling >> Aug 07, 2023  2:03 PM Alyse July wrote: Patient/patient representative is calling to schedule an appointment. Refer to attachments for appointment information. Patient mother schedule separate appointment for labs due to provider availability patient would be unable to complete fasting labs on the same day as appointment for Physical due to availability. Please place lab order for upcoming lab visit.

## 2023-08-08 ENCOUNTER — Ambulatory Visit: Payer: Self-pay | Admitting: Speech Pathology

## 2023-08-08 DIAGNOSIS — F800 Phonological disorder: Secondary | ICD-10-CM | POA: Diagnosis not present

## 2023-08-08 NOTE — Therapy (Signed)
 OUTPATIENT SPEECH LANGUAGE PATHOLOGY TREATMENT NOTE    Patient Name: Roy Carr MRN: [id number] DOB:[birth year], 38 y.o., male Today's Date: 08/08/2023  SPEECH THERAPY DISCHARGE SUMMARY  Visits from Start of Care: 20  Current functional level related to goals / functional outcomes: Pt demonstrates increased intelligibility and increased self correction of error'd productions in >50% of opportunities   Remaining deficits: Articulation disorder   Education / Equipment: Articulatory placement cues, visual feedback, auditory bombardment, caregiver instruction  Patient agrees to discharge. Patient goals were met. Patient is being discharged due to meeting the stated rehab goals.Aaron Aas     PCP: Reginal Capra, MD  REFERRING PROVIDER: Reginal Capra, MD  END OF SESSION:  End of Session - 08/08/23 0845     Visit Number 20    Number of Visits 21    Date for SLP Re-Evaluation 08/08/23    SLP Start Time 0845    SLP Stop Time  0930    SLP Time Calculation (min) 45 min    Activity Tolerance Patient tolerated treatment well              Past Medical History:  Diagnosis Date   Hypothyroid    prev per Dr Heywood Louder    Personal history of spine surgery    Spondylisthesis    grade 2 corrected   Trisomy 21    Varicose veins    intervention 5 2011   Past Surgical History:  Procedure Laterality Date   ABLATION SAPHENOUS VEIN W/ RFA     SPINE SURGERY     WISDOM TOOTH EXTRACTION     Patient Active Problem List   Diagnosis Date Noted   Elevated MCV 08/18/2017   Rash and nonspecific skin eruption 07/08/2013   Low HDL (under 40) 07/08/2013   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   PERTINENT HISTORY: Trisomy 36, previously received extensive ST to address communication, has not in many years  PAIN:  Are you having pain? No  FALLS: Has patient fallen in last 6 months?  No  LIVING ENVIRONMENT: Lives with: lives with their family Lives in: House/apartment  PLOF:  Level of assistance: Needed assistance with ADLs, Needed assistance with IADLS Employment: Full-time employment  PATIENT GOALS: to see if speech would be beneficial at this time to increase intelligibility   OBJECTIVE:  Note: Objective measures were completed at Evaluation unless otherwise noted.  GFTA Errors: Initial Medial Final  kw l r  dr f t  pl ng th  pr g g  sh v   sl r   g v   gl b   th    v    f    l    r    bl    br    tr    z    sw    p    h    k  st    sp    gr    j      PATIENT REPORTED OUTCOME MEASURES (PROM): Goal Attainment Scaling: Given a scale of 1-10, with 1 being unable to communicate, 10 being ideal communication abilities, where is Roy Carr at now? 7 What characterizes a 7? Is communicating, is able to be understood by familiar listeners with context, challenges without context, strangers or community members unable to understand, mom has challenges understanding at times which causes frustration, [place] Last is trying to use clear speech and repeat when model is provided  What would an 8 look like? Improved ability to slow down, able to make small talk with community people, is practicing speech 8: Improved ability to slow down, able to make small talk with community people, is practicing speech, initiating interactions  What would a 9 look like? Less  prompting for slowing down, increased self correction  TODAY'S TREATMENT 08/08/23: SLP initiated cumulative review of sounds targeted throughout ST. Guided pt through structured game providing usual model and articulatory placement cues to facilitate improved intelligibility. Pt demonstrates increased accuracy and consistency in productions of targeted sounds throughout today's session. Overall, decreased modeling needed and pt highly responsive to verbal cues for slowed rate for improved general intelligibility throughout session. Pt agreeable to discharge.   07/25/23: Addressed initial /r/ blends in today's session with review of functional words. Initiated structured game targeting improved intelligibility and correct articulatory placement with usual model and articulatory placement cues provided. Pt achieving 70% accuracy during initial drill practice, improved with subsequent dynamic activity with same target words. HEP materials provided.   07/18/23: Targeted initial /l/ blends in today's session. Initiated review of functional words, then facilitated structured game reinforcing proper articulatory placement for improved intelligibility via direct modeling, auditory bombardment, articulation cues and visual feedback. Pt demonstrating accuracy given moderate support for blend production, errors persist with gliding for /l/ in increased complex targets.   07/11/23: Addressed nasals in variety of word positions with pt demonstrating good accuracy with occasional need for verbal cues and model. Went through prior targets of challenging stimuli with pt demonstrating persistent errors with "th", /l/ blends, medial and final /l/.  Improvements noted with S blends particularly with /st, sw/.  With erroneous productions patient is producing two sounds to mark blend.  07/04/23: SLP targeted stopping of initial voiced and voiceless labiodental fricatives this session. Initiated review of functional words, then lead  pt through structured game reinforcing proper articulatory placement for improved intelligibility via direct modeling, auditory bombardment, articulation cues and visual feedback. Pt participated effectively benefiting from min/mod cues to correct incorrect productions in ~40% of opportunities. Pt benefiting from fading cues with more stimulable words (ex. family, fast, voice, visit). He required additional cuing to accurately produce more challenging multi syllabic words (vegetable, vanilla, vacuum etc.). Picture cards provided for carryover and additional practice at home.   PATIENT EDUCATION: Education details: see above Person educated: Patient and Parent Education method: Medical illustrator Education comprehension: verbalized understanding, returned demonstration, verbal cues required, and needs further education  HOME EXERCISE PROGRAM: Caregiver cues/modeling, practice with personally relevant, salient words    GOALS: Goals reviewed with patient? Yes  SHORT TERM GOALS: Target date: 05/02/2023  Pt will utilize strategies to slow rate of speech with usual mod-A at sentence level  Baseline: variable rate, increased rate decreases intelligibility.  Goal status: MET  2.  Pt will produce consonant clusters with 50% accuracy at word level during structured practice  Baseline: 25% accuracy  Goal status: MET  3.  Pt will produce t, th, y in all positions with 50% accuracy at word level during structured practice  Baseline: 25% accuracy, is stimulible for targeted speech sounds  Goal status: deferred -- pt benefiting from more informal approach targeting overall improved intelligibility   4.  Pt will complete HEP BID 6/7 days week Baseline: not established  Goal status: MET   LONG TERM GOALS: Target date:  08/08/2023 (recert- goals updated)  Mom will rate pt's speech as 8/10 via goal attainment scaling Baseline: 7, see above Goal status: MET  2.  Pt will correct error'd  sounds in 50% of opportunities at phrase level during structured practice  Baseline: attempts correction with support; correcting at word level  and occasionally in conversation (06/13/2023) Goal status: MET  3.  Pt will be 80% intelligible in question responses Baseline: 75% intelligible; improved utilization of strategies with decreased cueing required (06/13/2023) Goal status: MET  4.   Mom will rate pt's speech as 9/10 via goal attainment scaling by d/c  Baseline: 8/10 (see above)   Goal status: MET  5.   Pt will attempt self-correction for implementation of intelligibility strategies verbal cues only, no direct model   Baseline: provision of direct model for speech with decreased clarity  Goal status: MET   ASSESSMENT:  CLINICAL IMPRESSION: Patient is a 38 y.o. M who was seen today for ST session targeting improved intelligibility via reducing rate and shaping of error'd productions of variety of consonants. Therapy focused on highly salient and personally relevant stimuli for improved motivation and engagement throughout sessions. Pt's mother reports carryover of treatment strategies at home and ongoing motivation for addressing motor speech at home. Mother provided with tools to support ongoing practice at home for high frequency errors. Pt and mother are agreeable to d/c.  OBJECTIVE IMPAIRMENTS: Objective impairments include  articulation disorder . These impairments are limiting patient from effectively communicating at home and in community.Factors affecting potential to achieve goals and functional outcome are previous level of function.. Patient will benefit from skilled SLP services to address above impairments and improve overall function.  REHAB POTENTIAL: Good  PLAN:  SLP FREQUENCY: 1-2x/week  SLP DURATION: 12 weeks  PLANNED INTERVENTIONS: 92522- Speech Eval Sound Prod, Articulate, Phonological, 09811 Treatment of speech (30 or 45 min) , Multimodal communication  approach, SLP instruction and feedback, Compensatory strategies, and Patient/family education   Neva Barban, Student-SLP 08/08/2023, 8:45 AM  Check all possible CPT codes: See Planned Interventions List for Planned CPT Codes    Check all conditions that are expected to impact treatment: Cognitive Impairment or Intellectual disability   If treatment provided at initial evaluation, no treatment charged due to lack of authorization.

## 2023-08-11 NOTE — Addendum Note (Signed)
 Addended byReginal Capra on: 08/11/2023 06:51 PM   Modules accepted: Orders

## 2023-08-11 NOTE — Telephone Encounter (Signed)
 Future orders   have been placed and include blood work (and urine if obtainable  )

## 2023-08-18 ENCOUNTER — Other Ambulatory Visit: Payer: Self-pay | Admitting: Internal Medicine

## 2023-08-18 ENCOUNTER — Other Ambulatory Visit (HOSPITAL_COMMUNITY): Payer: Self-pay

## 2023-08-18 ENCOUNTER — Other Ambulatory Visit: Payer: Self-pay

## 2023-08-18 MED ORDER — SYNTHROID 25 MCG PO TABS
37.5000 ug | ORAL_TABLET | Freq: Every day | ORAL | 2 refills | Status: DC
  Filled 2023-08-18: qty 135, 90d supply, fill #0
  Filled 2023-11-16 – 2023-11-24 (×3): qty 135, 90d supply, fill #1

## 2023-09-02 ENCOUNTER — Other Ambulatory Visit (HOSPITAL_COMMUNITY): Payer: Self-pay

## 2023-10-14 ENCOUNTER — Ambulatory Visit: Payer: Self-pay | Admitting: Internal Medicine

## 2023-10-14 ENCOUNTER — Other Ambulatory Visit (INDEPENDENT_AMBULATORY_CARE_PROVIDER_SITE_OTHER)

## 2023-10-14 DIAGNOSIS — R718 Other abnormality of red blood cells: Secondary | ICD-10-CM | POA: Diagnosis not present

## 2023-10-14 DIAGNOSIS — Z9189 Other specified personal risk factors, not elsewhere classified: Secondary | ICD-10-CM

## 2023-10-14 DIAGNOSIS — E039 Hypothyroidism, unspecified: Secondary | ICD-10-CM

## 2023-10-14 DIAGNOSIS — R768 Other specified abnormal immunological findings in serum: Secondary | ICD-10-CM

## 2023-10-14 DIAGNOSIS — Z0000 Encounter for general adult medical examination without abnormal findings: Secondary | ICD-10-CM | POA: Diagnosis not present

## 2023-10-14 DIAGNOSIS — E786 Lipoprotein deficiency: Secondary | ICD-10-CM

## 2023-10-14 DIAGNOSIS — Q909 Down syndrome, unspecified: Secondary | ICD-10-CM

## 2023-10-14 LAB — LIPID PANEL
Cholesterol: 206 mg/dL — ABNORMAL HIGH (ref 0–200)
HDL: 34.2 mg/dL — ABNORMAL LOW (ref >39.00)
LDL Cholesterol: 149 mg/dL — ABNORMAL HIGH (ref 0–99)
NonHDL: 171.3
Total CHOL/HDL Ratio: 6
Triglycerides: 112 mg/dL (ref 0.0–149.0)
VLDL: 22.4 mg/dL (ref 0.0–40.0)

## 2023-10-14 LAB — HEPATIC FUNCTION PANEL
ALT: 19 U/L (ref 0–53)
AST: 21 U/L (ref 0–37)
Albumin: 3.9 g/dL (ref 3.5–5.2)
Alkaline Phosphatase: 72 U/L (ref 39–117)
Bilirubin, Direct: 0.2 mg/dL (ref 0.0–0.3)
Total Bilirubin: 1.1 mg/dL (ref 0.2–1.2)
Total Protein: 7.2 g/dL (ref 6.0–8.3)

## 2023-10-14 LAB — URINALYSIS, ROUTINE W REFLEX MICROSCOPIC
Bilirubin Urine: NEGATIVE
Hgb urine dipstick: NEGATIVE
Ketones, ur: NEGATIVE
Leukocytes,Ua: NEGATIVE
Nitrite: NEGATIVE
Specific Gravity, Urine: 1.01 (ref 1.000–1.030)
Total Protein, Urine: NEGATIVE
Urine Glucose: NEGATIVE
Urobilinogen, UA: 0.2 (ref 0.0–1.0)
WBC, UA: NONE SEEN (ref 0–?)
pH: 6 (ref 5.0–8.0)

## 2023-10-14 LAB — BASIC METABOLIC PANEL WITH GFR
BUN: 11 mg/dL (ref 6–23)
CO2: 29 meq/L (ref 19–32)
Calcium: 9.4 mg/dL (ref 8.4–10.5)
Chloride: 101 meq/L (ref 96–112)
Creatinine, Ser: 0.8 mg/dL (ref 0.40–1.50)
GFR: 114.14 mL/min (ref >60.00)
Glucose, Bld: 88 mg/dL (ref 70–99)
Potassium: 4 meq/L (ref 3.5–5.1)
Sodium: 137 meq/L (ref 135–145)

## 2023-10-14 LAB — CBC WITH DIFFERENTIAL/PLATELET
Basophils Absolute: 0.1 10*3/uL (ref 0.0–0.1)
Basophils Relative: 2.4 % (ref 0.0–3.0)
Eosinophils Absolute: 0 10*3/uL (ref 0.0–0.7)
Eosinophils Relative: 1.1 % (ref 0.0–5.0)
HCT: 42.9 % (ref 39.0–52.0)
Hemoglobin: 14.7 g/dL (ref 13.0–17.0)
Lymphocytes Relative: 25.8 % (ref 12.0–46.0)
Lymphs Abs: 1.1 10*3/uL (ref 0.7–4.0)
MCHC: 34.3 g/dL (ref 30.0–36.0)
MCV: 101 fl — ABNORMAL HIGH (ref 78.0–100.0)
Monocytes Absolute: 0.4 10*3/uL (ref 0.1–1.0)
Monocytes Relative: 9 % (ref 3.0–12.0)
Neutro Abs: 2.5 10*3/uL (ref 1.4–7.7)
Neutrophils Relative %: 61.7 % (ref 43.0–77.0)
Platelets: 254 10*3/uL (ref 150.0–400.0)
RBC: 4.25 Mil/uL (ref 4.22–5.81)
RDW: 13.2 % (ref 11.5–15.5)
WBC: 4.1 10*3/uL (ref 4.0–10.5)

## 2023-10-14 LAB — VITAMIN B12: Vitamin B-12: 355 pg/mL (ref 211–911)

## 2023-10-14 LAB — MICROALBUMIN / CREATININE URINE RATIO
Creatinine,U: 120.6 mg/dL
Microalb Creat Ratio: UNDETERMINED mg/g (ref 0.0–30.0)
Microalb, Ur: <0.7 mg/dL

## 2023-10-14 LAB — TSH: TSH: 3 u[IU]/mL (ref 0.35–5.50)

## 2023-10-14 LAB — T4, FREE: Free T4: 0.85 ng/dL (ref 0.60–1.60)

## 2023-10-14 LAB — HEMOGLOBIN A1C: Hgb A1c MFr Bld: 5.3 % (ref 4.6–6.5)

## 2023-10-14 NOTE — Progress Notes (Signed)
 Lipids not as good as in past  rest of testing normal or stable  results . Will review at upcoming visit

## 2023-10-15 LAB — IGA: Immunoglobulin A: 562 mg/dL — ABNORMAL HIGH (ref 47–310)

## 2023-10-21 ENCOUNTER — Ambulatory Visit (INDEPENDENT_AMBULATORY_CARE_PROVIDER_SITE_OTHER): Admitting: Internal Medicine

## 2023-10-21 ENCOUNTER — Other Ambulatory Visit: Payer: Self-pay | Admitting: Internal Medicine

## 2023-10-21 ENCOUNTER — Telehealth: Payer: Self-pay | Admitting: Internal Medicine

## 2023-10-21 VITALS — BP 100/66 | HR 56 | Temp 98.3°F | Ht 64.0 in | Wt 132.0 lb

## 2023-10-21 DIAGNOSIS — E786 Lipoprotein deficiency: Secondary | ICD-10-CM

## 2023-10-21 DIAGNOSIS — E039 Hypothyroidism, unspecified: Secondary | ICD-10-CM

## 2023-10-21 DIAGNOSIS — R0683 Snoring: Secondary | ICD-10-CM

## 2023-10-21 DIAGNOSIS — R768 Other specified abnormal immunological findings in serum: Secondary | ICD-10-CM

## 2023-10-21 DIAGNOSIS — Q909 Down syndrome, unspecified: Secondary | ICD-10-CM | POA: Diagnosis not present

## 2023-10-21 DIAGNOSIS — Z0000 Encounter for general adult medical examination without abnormal findings: Secondary | ICD-10-CM | POA: Diagnosis not present

## 2023-10-21 DIAGNOSIS — R718 Other abnormality of red blood cells: Secondary | ICD-10-CM

## 2023-10-21 NOTE — Patient Instructions (Addendum)
 Good to see  you today  Continue to optimize exercise. And diet as planned . Repeat lipids and lipo a in about 6 months.  we can do a lipo panel.  Can follow snoring  sleep   .

## 2023-10-21 NOTE — Progress Notes (Signed)
 Chief Complaint  Patient presents with   Annual Exam    HPI: Patient  Roy Carr  38 y.o. comes in today for Preventive Health Care visit  Here with  mom  Since last visit  getting weight back down Second[place]  servings not and  and weight is better.  Special olypmics  participant  Swimming  Sundays[facility]  [facility] but [facility] over 20 [facility] and  good exercise tolerance  Dog walking.  Still arc bark and outings  Skin about the same . Asks about  optimum lipids  ? Ct calcium score? 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   Early cataracts, bilateral 07/03/2012   Personal history of spine surgery    Seborrheic dermatitis of scalp 06/23/2010   Trisomy 21    Spondylolisthesis    Hypothyroidism 06/16/2009   VARICOSE VEINS, LOWER EXTREMITIES 06/16/2009   XERODERMA 06/16/2009     Health Maintenance  Topic Date Due   Medicare Annual Wellness (AWV)  Never done   Hepatitis B Vaccines (1 of 3 - 19+ 3-dose series) Never done   COVID-19 Vaccine (8 - 2024-25 season) 11/06/2023 (Originally 12/15/2022)   HPV VACCINES (1 - 3-dose SCDM series) 10/20/2024 (Originally 08/20/2014)   HIV Screening  10/20/2024 (Originally 08/20/2002)   INFLUENZA VACCINE  11/14/2023   DTaP/Tdap/Td (3 - Td or Tdap) 12/10/2028   Hepatitis C Screening  Completed   Meningococcal B Vaccine  Aged Out      ROS:  GEN/ HEENT: No fever, significant weight changes sweats headaches vision problems hearing changes, CV/ PULM; No chest pain shortness of breath cough, syncope,edema  change in exercise tolerance. GI /GU: No adominal pain, vomiting, change in bowel habits. No blood in the stool. No significant GU symptoms. SKIN/HEME: ,no acute  skin rashes suspicious lesions or bleeding. No lymphadenopathy, nodules, masses.  NEURO/ PSYCH:  No neurologic signs such as weakness numbness. No depression anxiety. IMM/ Allergy: No unusual infections.  Allergy .   REST of 12 system review negative except as per HPI   Past Medical History:  Diagnosis Date   Hypothyroid    prev per Dr Hershal    Personal history of spine surgery    Spondylisthesis    grade 2 corrected   Trisomy 21    Varicose veins    intervention 5 2011    Past Surgical History:  Procedure Laterality Date   ABLATION SAPHENOUS VEIN W/ RFA     SPINE SURGERY     WISDOM TOOTH EXTRACTION      Family History  Problem Relation Age of Onset   Melanoma Father     Social History   Socioeconomic History   Marital status: Single    Spouse name: Not on file   Number of children: Not on file   Years of education: Not on file   Highest education level: GED or equivalent  Occupational History   Not on file  Tobacco Use   Smoking status: Never   Smokeless tobacco: Never  Vaping Use   Vaping status: Never Used  Substance and Sexual Activity   Alcohol  use: No   Drug use: No  Sexual activity: Not on file  Other Topics Concern   Not on file  Social History Narrative   Legal guardian: Fredric Slabach   Sleep doing well   HH of 3   2 dogs   Swimming, drawing and singing   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   [employer] [place]   Neg CV eval, no cervical instability by xray            Social Drivers of Health   Financial Resource Strain: Low Risk  (10/21/2023)   Overall Financial Resource Strain (CARDIA)    Difficulty of Paying Living Expenses: Not hard at all  Food Insecurity: No Food Insecurity (10/21/2023)   Hunger Vital Sign    Worried About Running Out of Food in the Last Year: Never true    Ran Out of Food in the Last Year: Never true  Transportation Needs: No Transportation Needs (10/21/2023)   PRAPARE -  Administrator, Civil Service (Medical): No    Lack of Transportation (Non-Medical): No  Physical Activity: Sufficiently Active (10/21/2023)   Exercise Vital Sign    Days of Exercise per Week: 5 days    Minutes of Exercise per Session: 30 min  Stress: No Stress Concern Present (10/21/2023)   [employer] of Occupational Health - Occupational Stress Questionnaire    Feeling of Stress: Not at all  Social Connections: Unknown (10/21/2023)   Social Connection and Isolation Panel    Frequency of Communication with Friends and Family: More than three times a week    Frequency of Social Gatherings with Friends and Family: More than three times a week    Attends Religious Services: More than 4 times per year    Active Member of [employer] or Organizations: Yes    Attends Engineer, structural: More than 4 times per year    Marital Status: Patient declined    Outpatient Medications Prior to Visit  Medication Sig Dispense Refill   SYNTHROID  25 MCG tablet Take 1 and 1/2 tablets by mouth daily. 135 tablet 2   valACYclovir  (VALTREX ) 1000 MG tablet TAKE 2 TABLETS BY MOUTH TWICE DAILY FOR COLD SORES (Patient taking differently: as needed.) 30 tablet PRN   COVID-19 mRNA vaccine, Pfizer, 30 MCG/0.3ML injection Inject into the muscle. 0.3 mL 0   Multiple Vitamins-Minerals (MULTIVITAMIN ADULT) CHEW Chew 1 tablet by mouth daily.     No facility-administered medications prior to visit.     EXAM:  BP 100/66 (BP Location: Left Arm, Patient Position: Sitting, Cuff Size: Normal)   Pulse (!) 56   Temp 98.3 F (36.8 C) (Oral)   Ht 5' 4 (1.626 m)   Wt 132 lb (59.9 kg)   SpO2 98%   BMI 22.66 kg/m   Body mass index is 22.66 kg/m. Wt Readings from Last 3 Encounters:  10/21/23 132 lb (59.9 kg)  10/10/22 143 lb 3.2 oz (65 kg)  01/30/21 121 lb 9.6 oz (55.2 kg)    Physical Exam: Vital signs reviewed HZW:Uypd is a downs appearing  well-nourished alert cooperative   non verbal but  cooperative with mom in room  in no acute distress.  HEENT: atraumatic , Eyes: PERRL EOM's full, conjunctiva clear, Nares: paten,t no deformity discharge or tenderness., Ears: no deformity EAC's clear TMs with normal landmarks. NECK: supple without masses, thyromegaly or bruits. CHEST/PULM:  Clear to auscultation and percussion breath sounds equal no wheeze , rales  or rhonchi.  CV: PMI is nondisplaced, S1 S2 no gallops, murmurs, rubs. Peripheral pulses are present without delay.No JVD .  ABDOMEN: Bowel sounds normal nontender  No guard or rebound, no hepato splenomegal no CVA tenderness.  Extremtities:  No clubbing cyanosis or edema, no acute joint swelling or redness no focal atrophy NEURO:  ambulatory  no focal deficits  downs habitus  independent gait  SKIN: No acute rashes normal turgor, color, no bruising or petechiae. Pilaris follicular  esp legs  no acute infection LN: no cervical axillary inguinal adenopathy obvious  Lab Results  Component Value Date   WBC 4.1 10/14/2023   HGB 14.7 10/14/2023   HCT 42.9 10/14/2023   PLT 254.0 10/14/2023   GLUCOSE 88 10/14/2023   CHOL 206 (H) 10/14/2023   TRIG 112.0 10/14/2023   HDL 34.20 (L) 10/14/2023   LDLDIRECT 160.4 07/03/2012   LDLCALC 149 (H) 10/14/2023   ALT 19 10/14/2023   AST 21 10/14/2023   NA 137 10/14/2023   K 4.0 10/14/2023   CL 101 10/14/2023   CREATININE 0.80 10/14/2023   BUN 11 10/14/2023   CO2 29 10/14/2023   TSH 3.00 10/14/2023   HGBA1C 5.3 10/14/2023   MICROALBUR <0.7 10/14/2023    BP Readings from Last 3 Encounters:  10/21/23 100/66  10/10/22 104/76  01/30/21 110/60    Lab results reviewed with Mom   ASSESSMENT AND PLAN:  Discussed the following assessment and plan:    ICD-10-CM   1. Visit for preventive health examination  Z00.00     2. Trisomy 21  Q90.9     3. Low HDL (under 40)  E78.6     4. Elevated MCV  R71.8     5. 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 Snoring  R06.83      7. Hypothyroidism, unspecified type  E03.9     Optimize exercise  as planned  At this time would wait on  ct calcium score  Fu lipids lipo profile in about 6 months and go from there. Snoring  ... at risk for osa downs etc but day activity  is good.  Consider if further eval would be helpful Disc paths to eval .  Will reach out to local about working with patients such as  [name] to consider  OSA  Return in about 6 months (around 04/22/2024) for lab appt  in about 6 months .  Patient Care Team: Grizelda Piscopo, Apolinar POUR, MD as PCP - General Camillo Golas, MD as Attending Physician (Ophthalmology) Joshua Sieving, MD as Consulting Physician (Dermatology) Patient Instructions  Good to see  you today  Continue to optimize exercise. And diet as planned . Repeat lipids and lipo a in about 6 months.  we can do a lipo panel.  Can follow snoring  sleep   .    Levette Paulick K. Baruc Tugwell M.D.

## 2023-10-21 NOTE — Telephone Encounter (Signed)
 Noted

## 2023-10-21 NOTE — Telephone Encounter (Signed)
 Pt mom called in and stated she received a Fax note along with her son AWV summary and she is sure it was on accident. Just wanted to provide that info to us  before she discard it:  Roy Carr DOB [birth year]  Most recent chart notes (6 mths) sent to [facility] Fax #[phone]  Please advise.

## 2023-10-22 NOTE — Progress Notes (Signed)
 Pt is scheduled for 01/01/2024

## 2023-10-25 NOTE — Telephone Encounter (Signed)
 Ok will do . Sent in order

## 2023-10-31 ENCOUNTER — Other Ambulatory Visit

## 2023-11-06 ENCOUNTER — Ambulatory Visit
Admission: RE | Admit: 2023-11-06 | Discharge: 2023-11-06 | Disposition: A | Discharge status: Home / Self Care | Source: Ambulatory Visit | Attending: Internal Medicine | Admitting: Internal Medicine

## 2023-11-06 DIAGNOSIS — Z9189 Other specified personal risk factors, not elsewhere classified: Secondary | ICD-10-CM

## 2023-11-06 DIAGNOSIS — Q909 Down syndrome, unspecified: Secondary | ICD-10-CM

## 2023-11-06 DIAGNOSIS — E786 Lipoprotein deficiency: Secondary | ICD-10-CM

## 2023-11-13 ENCOUNTER — Ambulatory Visit: Payer: Self-pay | Admitting: Internal Medicine

## 2023-11-13 NOTE — Progress Notes (Signed)
 Ct calcium score is 0  no abnormality  reassuring.    But consider repeating in 5 years.

## 2023-11-19 ENCOUNTER — Other Ambulatory Visit (HOSPITAL_COMMUNITY): Payer: Self-pay

## 2023-11-19 ENCOUNTER — Other Ambulatory Visit: Payer: Self-pay

## 2023-11-20 ENCOUNTER — Other Ambulatory Visit (HOSPITAL_COMMUNITY): Payer: Self-pay

## 2023-11-22 ENCOUNTER — Other Ambulatory Visit (HOSPITAL_COMMUNITY): Payer: Self-pay

## 2023-11-22 ENCOUNTER — Encounter (HOSPITAL_COMMUNITY): Payer: Self-pay

## 2023-11-24 ENCOUNTER — Other Ambulatory Visit (HOSPITAL_COMMUNITY): Payer: Self-pay

## 2023-12-23 ENCOUNTER — Encounter: Payer: Self-pay | Admitting: Internal Medicine

## 2023-12-24 ENCOUNTER — Other Ambulatory Visit (HOSPITAL_COMMUNITY): Payer: Self-pay

## 2023-12-24 MED ORDER — COVID-19MRNA BIVAL VACC PFIZER 30 MCG/0.3ML IM SUSP
0.3000 mL | Freq: Once | INTRAMUSCULAR | 0 refills | Status: AC
  Filled 2023-12-24: qty 0.3, 1d supply, fill #0

## 2023-12-25 ENCOUNTER — Other Ambulatory Visit (HOSPITAL_COMMUNITY): Payer: Self-pay

## 2023-12-29 ENCOUNTER — Other Ambulatory Visit (HOSPITAL_BASED_OUTPATIENT_CLINIC_OR_DEPARTMENT_OTHER): Payer: Self-pay

## 2023-12-29 MED ORDER — FLUZONE 0.5 ML IM SUSY
0.5000 mL | PREFILLED_SYRINGE | Freq: Once | INTRAMUSCULAR | 0 refills | Status: AC
Start: 2023-12-29 — End: 2023-12-30
  Filled 2023-12-29: qty 0.5, 1d supply, fill #0

## 2023-12-29 MED ORDER — COMIRNATY 30 MCG/0.3ML IM SUSY
0.3000 mL | PREFILLED_SYRINGE | Freq: Once | INTRAMUSCULAR | 0 refills | Status: AC
  Filled 2023-12-29: qty 0.3, 1d supply, fill #0

## 2024-01-01 ENCOUNTER — Encounter (HOSPITAL_BASED_OUTPATIENT_CLINIC_OR_DEPARTMENT_OTHER): Payer: Self-pay | Admitting: Pulmonary Disease

## 2024-01-01 ENCOUNTER — Ambulatory Visit (INDEPENDENT_AMBULATORY_CARE_PROVIDER_SITE_OTHER): Admitting: Pulmonary Disease

## 2024-01-01 VITALS — BP 108/75 | HR 59 | Ht 64.0 in | Wt 133.9 lb

## 2024-01-01 DIAGNOSIS — G4733 Obstructive sleep apnea (adult) (pediatric): Secondary | ICD-10-CM

## 2024-01-01 NOTE — Progress Notes (Signed)
 New Patient Pulmonology Office Visit   Subjective:  Patient ID: Roy Carr, male    DOB: 04-Nov-1985  MRN: [id number]  Referred by: Charlett Apolinar POUR, MD  CC:  Chief Complaint  Patient presents with   Consult    Snoring    Discussed the use of AI scribe software for clinical note transcription with the patient, who gave verbal consent to proceed.  History of Present Illness  Discussed the use of AI scribe software for clinical note transcription with the patient, who gave verbal consent to proceed.  History of Present Illness   Roy Carr is a 38 year old male with Down syndrome who presents for evaluation of obstructive sleep apnea. He is accompanied by his mother, who is his primary caregiver. (She is an AML survivor!)He was referred by Dr. Mahala for evaluation of obstructive sleep apnea.  His sister, a Publishing rights manager, has noted snoring and frequent awakenings during the night while he was living with her in [place]. His father does not hear snoring at his current residence. He typically goes to bed around 8:30 or 9:00 PM and wakes up on his own around 7:00 or 7:30 AM. He occasionally falls asleep during car rides but does not take planned naps during the day.  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 He is in good physical shape, having lost weight after gaining 25 pounds while living with his sisters. He currently weighs around 133 pounds. He is dependent on his father for daily living activities, although he can perform tasks such as showering, brushing his teeth, and dressing himself.     ESS3    ROS  Constitutional: negative for anorexia, fevers and sweats  Eyes: negative for irritation, redness and visual disturbance  Ears, nose, mouth, throat, and face: negative for earaches, epistaxis, nasal congestion and sore throat  Respiratory: negative for cough, dyspnea on  exertion, sputum and wheezing  Cardiovascular: negative for chest pain, dyspnea, lower extremity edema, orthopnea, palpitations and syncope  Gastrointestinal: negative for abdominal pain, constipation, diarrhea, melena, nausea and vomiting  Genitourinary:negative for dysuria, frequency and hematuria  Hematologic/lymphatic: negative for bleeding, easy bruising and lymphadenopathy  Musculoskeletal:negative for arthralgias, muscle weakness and stiff joints  Neurological: negative for coordination problems, gait problems, headaches and weakness  Endocrine: negative for diabetic symptoms including polydipsia, polyuria and weight loss   Allergies: Patient has no known allergies.  Current Outpatient Medications:    SYNTHROID  25 MCG tablet, Take 1 and 1/2 tablets by mouth daily., Disp: 135 tablet, Rfl: 2   valACYclovir  (VALTREX ) 1000 MG tablet, TAKE 2 TABLETS BY MOUTH TWICE DAILY FOR COLD SORES (Patient taking differently: Take 1,000 mg by mouth 2 (two) times daily.), Disp: 30 tablet, Rfl: PRN Past Medical History:  Diagnosis Date   Hypothyroid    prev per Dr Hershal    Personal history of spine surgery    Spondylisthesis    grade 2 corrected   Trisomy 21    Varicose veins    intervention 5 2011   Past Surgical History:  Procedure Laterality Date   ABLATION SAPHENOUS VEIN W/ RFA     SPINE SURGERY     WISDOM TOOTH EXTRACTION     Family History  Problem Relation Age of Onset   Melanoma Father    Social History   Socioeconomic History   Marital status: Single  Spouse name: Not on file   Number of children: Not on file   Years of education: Not on file   Highest education level: GED or equivalent  Occupational History   Not on file  Tobacco Use   Smoking status: Never   Smokeless tobacco: Never  Vaping Use   Vaping status: Never Used  Substance and Sexual Activity   Alcohol  use: No   Drug use: No   Sexual activity: Not on file  Other Topics Concern   Not on file  Social  History Narrative   Legal guardian: [place]   Sleep doing well   HH of 3   2 dogs   Swimming, drawing and singing   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   [employer] [place]   Neg CV eval, no cervical instability by xray            Social Drivers of Health   Financial Resource Strain: Low Risk  (10/21/2023)   Overall Financial Resource Strain (CARDIA)    Difficulty of Paying Living Expenses: Not hard at all  Food Insecurity: No Food Insecurity (10/21/2023)   Hunger Vital Sign    Worried About Running Out of Food in the Last Year: Never true    Ran Out of Food in the Last Year: Never true  Transportation Needs: No Transportation Needs (10/21/2023)   PRAPARE - Administrator, Civil Service (Medical): No    Lack of Transportation (Non-Medical): No  Physical Activity: Sufficiently Active (10/21/2023)   Exercise Vital Sign    Days of Exercise per Week: 5 days    Minutes of Exercise per Session: 30 min  Stress: No Stress Concern Present (10/21/2023)   [employer] of Occupational Health - Occupational Stress Questionnaire    Feeling of Stress: Not at all  Social Connections: Unknown (10/21/2023)   Social Connection and Isolation Panel    Frequency of Communication with Friends and Family: More than three times a week    Frequency of Social Gatherings with Friends and Family: More than three times a week    Attends Religious Services: More than 4 times per year    Active Member of [employer] or Organizations: Yes    Attends Banker Meetings: More than 4 times per year    Marital Status: Patient declined  Intimate Partner Violence: Unknown (07/20/2021)   Received from [facility]   HITS    Physically Hurt: Not on file    Insult or Talk Down To: Not on file    Threaten Physical Harm: Not on file    Scream or Curse: Not on file       Objective:  BP 108/75   Pulse (!) 59   Ht 5' 4 (1.626 m)   Wt 133 lb 14.4 oz  (60.7 kg)   SpO2 100%   BMI 22.98 kg/m    Physical Exam  Gen. Pleasant, well-nourished, in no distress ENT - no thrush, no pallor/icterus,no post nasal drip, class 2 airway Neck: No JVD, no thyromegaly, no carotid bruits Lungs: no use of accessory muscles, no dullness to percussion, clear without rales or rhonchi  Cardiovascular: Rhythm regular, heart sounds  normal, no murmurs or gallops, no peripheral edema Musculoskeletal: No deformities, no cyanosis or clubbing       Assessment & Plan:  Assessment and Plan Assessment & Plan  Assessment and Plan    Suspected obstructive sleep  apnea in Down syndrome Suspected obstructive sleep apnea (OSA) due to snoring and disrupted sleep patterns as reported by his sister. High prevalence of OSA in individuals with Down syndrome. No significant daytime sleepiness or cardiovascular symptoms reported. Good physical condition with regular exercise and stable weight. - Schedule home sleep test using mail-in device from [employer] to assess severity of OSA. - If mild OSA, consider watchful waiting if asymptomatic and no cardiovascular impact. - If moderate to severe OSA, consider CPAP trial and evaluate for implant if CPAP is not tolerated. - Discussed potential treatment options including CPAP and implant if OSA is confirmed. Explained the severity scale for OSA and its implications on treatment decisions. Mild OSA may not require treatment if asymptomatic and no cardiovascular impact. Moderate to severe OSA may necessitate intervention to prevent long-term cardiovascular effects. Discussed CPAP and implant as treatment options, with implant being a second-line therapy if CPAP is not tolerated. Implant is similar to a pacemaker with a battery life of 10-15 years and requires outpatient surgery for placement.            No follow-ups on file.   Harden ROCKFORD Jude, MD

## 2024-01-01 NOTE — Patient Instructions (Addendum)
   VISIT SUMMARY: Today, you came in for an evaluation of suspected obstructive sleep apnea (OSA). Your sister noticed snoring and frequent awakenings at night, which led to this referral. You are in good physical shape, have regular exercise habits, and maintain a stable weight. We discussed the potential for OSA, especially given its higher prevalence in individuals with Down syndrome.  YOUR PLAN: -SUSPECTED OBSTRUCTIVE SLEEP APNEA IN DOWN SYNDROME: Obstructive sleep apnea (OSA) is a condition where the airway becomes blocked during sleep, causing breathing interruptions. Given your sister's observations of snoring and disrupted sleep, we suspect you might have OSA. We will schedule a home sleep test using a mail-in device from [employer] to determine the severity. If the test shows mild OSA and you have no symptoms or cardiovascular issues, we might not need to treat it. However, if it shows moderate to severe OSA, we will consider a CPAP machine to help keep your airway open during sleep. If CPAP is not suitable, we might look into an implant, which is a small device placed in your body to help manage the condition. This implant works like a pacemaker and requires a minor surgery to place. We will decide on the best treatment based on your test results and symptoms.  INSTRUCTIONS: We will schedule a home sleep test using a mail-in device from [employer] to assess the severity of your obstructive sleep apnea. Please follow the instructions provided with the device and complete the test as soon as possible. We will review the results and discuss the next steps based on the findings.                      Contains text generated by Abridge.                                 Contains text generated by Abridge.

## 2024-01-01 NOTE — Progress Notes (Signed)
 Epworth Sleepiness Scale  Use the following scale to choose the most appropriate number for each situation. 0 Would never nod off 1  Slight  chance of nodding off 2 Moderate chance of nodding off 3 High chance of nodding off  Sitting and reading: 1 Watching TV: 0 Sitting, inactive, in a public place (e.g., in a meeting, theater, or dinner event): 0 As a passenger in a car for an hour or more without stopping for a break: 2 Lying down to rest when circumstances permit:0 Sitting and talking to someone: 0 Sitting quietly after a meal without alcohol : 0 In a car, while stopped for a few  minutes in traffic or at a light: 0  TOTOAL: 3

## 2024-02-02 ENCOUNTER — Other Ambulatory Visit (HOSPITAL_COMMUNITY): Payer: Self-pay

## 2024-02-04 ENCOUNTER — Other Ambulatory Visit: Payer: Self-pay

## 2024-02-04 ENCOUNTER — Other Ambulatory Visit (HOSPITAL_COMMUNITY): Payer: Self-pay

## 2024-02-04 MED ORDER — SYNTHROID 25 MCG PO TABS
37.5000 ug | ORAL_TABLET | Freq: Every day | ORAL | 2 refills | Status: AC
  Filled 2024-02-04 – 2024-02-05 (×2): qty 135, 90d supply, fill #0
  Filled 2024-05-04: qty 135, 90d supply, fill #1

## 2024-02-05 ENCOUNTER — Other Ambulatory Visit: Payer: Self-pay

## 2024-02-06 ENCOUNTER — Other Ambulatory Visit: Payer: Self-pay

## 2024-02-27 ENCOUNTER — Encounter

## 2024-02-27 DIAGNOSIS — G4733 Obstructive sleep apnea (adult) (pediatric): Secondary | ICD-10-CM

## 2024-03-08 ENCOUNTER — Telehealth: Payer: Self-pay | Admitting: Pulmonary Disease

## 2024-03-08 DIAGNOSIS — R069 Unspecified abnormalities of breathing: Secondary | ICD-10-CM | POA: Diagnosis not present

## 2024-03-08 NOTE — Telephone Encounter (Signed)
 HST showed severe OSA with AHI 33/ hr & low sat of 84% Ideally he should have a CPAP titration study (since there were some central/mixed apneas)   If mom feels he will not tolerate then  Suggest autoCPAP  5-15 cm, mask of choice OV with me/APP in 6 wks after starting

## 2024-03-09 NOTE — Telephone Encounter (Signed)
 Left pt mother a message to notify and to let us  know if we can order for pt

## 2024-03-10 ENCOUNTER — Ambulatory Visit (HOSPITAL_BASED_OUTPATIENT_CLINIC_OR_DEPARTMENT_OTHER): Payer: Self-pay | Admitting: Pulmonary Disease

## 2024-03-10 ENCOUNTER — Encounter (HOSPITAL_BASED_OUTPATIENT_CLINIC_OR_DEPARTMENT_OTHER): Payer: Self-pay | Admitting: Pulmonary Disease

## 2024-03-10 NOTE — Telephone Encounter (Signed)
 Please advise

## 2024-03-10 NOTE — Telephone Encounter (Signed)
My chart message sent to pt mother 

## 2024-03-15 NOTE — Telephone Encounter (Signed)
 Please advise on when you would like to see him as we have nothing with you until after first of year

## 2024-03-16 NOTE — Telephone Encounter (Signed)
 Pt has appt on Thursday at 845am

## 2024-03-18 ENCOUNTER — Ambulatory Visit (INDEPENDENT_AMBULATORY_CARE_PROVIDER_SITE_OTHER): Admitting: Pulmonary Disease

## 2024-03-18 ENCOUNTER — Encounter (HOSPITAL_BASED_OUTPATIENT_CLINIC_OR_DEPARTMENT_OTHER): Payer: Self-pay | Admitting: Pulmonary Disease

## 2024-03-18 VITALS — BP 104/70 | HR 73 | Ht 64.0 in | Wt 132.6 lb

## 2024-03-18 DIAGNOSIS — G4733 Obstructive sleep apnea (adult) (pediatric): Secondary | ICD-10-CM

## 2024-03-18 NOTE — Progress Notes (Signed)
   Subjective:    Patient ID: Roy Carr, male    DOB: 1985/07/22, 38 y.o.   MRN: [id number]   38 year old male with Down syndrome who presents for evaluation of obstructive sleep apnea.   -mom, is his primary caregiver. 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  History of Present Illness Roy Carr is a 38 year old male with sleep apnea who presents for follow-up.  A prior sleep study showed both obstructive and central apneas, predominantly obstructive. He found the study uncomfortable, especially the nasal prongs, but completed it.  He has frequent nocturnal awakenings and significant daytime sleepiness. His mother notes he falls asleep quickly in the car, even on short trips. This has limited some usual activities, including swimming, especially after discomfort from the nasal study.  He lives with his family.    Significant tests/ events reviewed  HST showed severe OSA with AHI 33/ hr & low sat of 84%    Review of Systems  neg for any significant sore throat, dysphagia, itching, sneezing, nasal congestion or excess/ purulent secretions, fever, chills, sweats, unintended wt loss, pleuritic or exertional cp, hempoptysis, orthopnea pnd or change in chronic leg swelling. Also denies presyncope, palpitations, heartburn, abdominal pain, nausea, vomiting, diarrhea or change in bowel or urinary habits, dysuria,hematuria, rash, arthralgias, visual complaints, headache, numbness weakness or ataxia.      Objective:   Physical Exam  Gen. Pleasant, well-nourished, in no distress ENT - bite normal, no pallor/icterus,no post nasal drip Neck: No JVD, no thyromegaly, no carotid bruits Lungs: no use of accessory muscles, no dullness to percussion, clear without rales or rhonchi   Cardiovascular: Rhythm regular, heart sounds  normal, no murmurs or gallops, no peripheral edema Musculoskeletal: No deformities, no cyanosis or clubbing        Assessment & Plan:   Assessment and Plan Assessment & Plan Obstructive and central sleep apnea Obstructive sleep apnea is predominant, likely due to anatomical factors associated with Down syndrome, including jaw and tongue alignment. Central apneas are present and occur as frequently as obstructive apneas. CPAP is expected to address obstructive events, but not central apneas. Mask tolerance is a concern, but once asleep, he is expected to tolerate the mask well. Behavioral intervention is necessary to aid in mask acceptance. If CPAP is not tolerated, a lab-based sleep study may be required to assess central apneas and determine the need for a different machine with a backup rate. If CPAP and lab study are unsuccessful, an implant Earl) may be considered, though it does not address central apneas. - Prescribed CPAP with auto settings (5-15 cm H2O) to address obstructive sleep apnea. - Sent prescription to a supply company for CPAP machine and mask. - Coordinated with insurance for coverage and pickup of CPAP equipment. - Instructed on behavioral interventions to aid in mask acceptance, including bedtime rituals and initial assistance with mask application. - Monitor CPAP usage and tolerance over the first few nights. - If CPAP is not tolerated, will consider a lab-based sleep study to assess central apneas and determine need for a different machine. - If CPAP and lab study are unsuccessful, will consider Inspire implant as a last resort.

## 2024-03-18 NOTE — Patient Instructions (Addendum)
 AutoCPAP 5-15 with N30 nasal mask   VISIT SUMMARY: You came in today for a follow-up visit regarding your sleep apnea. We discussed the results of your previous sleep study, which showed both obstructive and central sleep apneas, with obstructive being more predominant. We also talked about your frequent awakenings at night and daytime sleepiness.  YOUR PLAN: -OBSTRUCTIVE AND CENTRAL SLEEP APNEA: Obstructive sleep apnea is when your airway becomes blocked during sleep, often due to anatomical factors, while central sleep apnea is when your brain doesn't send the right signals to control breathing. We have prescribed a CPAP machine with auto settings (5-15 cm H2O) to help manage your obstructive sleep apnea. The prescription has been sent to a supply company, and we have coordinated with your insurance for coverage and pickup. We also discussed behavioral interventions to help you get used to the mask, such as bedtime rituals and initial assistance with mask application. We will monitor your CPAP usage and tolerance over the first few nights. If you cannot tolerate the CPAP, we may need to conduct a lab-based sleep study to assess your central apneas and determine if a different machine is needed. As a last resort, we may consider an Inspire implant, although it does not address central apneas.  INSTRUCTIONS: Please start using the CPAP machine as prescribed and follow the behavioral interventions we discussed to help you get used to the mask. Monitor your usage and tolerance over the first few nights. If you experience any issues or cannot tolerate the CPAP, please contact our office so we can consider further steps, including a lab-based sleep study or other treatments.                      Contains text generated by Abridge.                                 Contains text generated by Abridge.

## 2024-05-04 ENCOUNTER — Other Ambulatory Visit (HOSPITAL_COMMUNITY): Payer: Self-pay

## 2024-08-12 ENCOUNTER — Ambulatory Visit (HOSPITAL_BASED_OUTPATIENT_CLINIC_OR_DEPARTMENT_OTHER): Admitting: Pulmonary Disease
